# Patient Record
Sex: Female | Born: 1945 | Race: White | Hispanic: No | Marital: Single | Smoking: Never smoker
Health system: Southern US, Community
[De-identification: ages and names within clinical notes are randomized; demographics above are authoritative.]

## PROBLEM LIST (undated history)

## (undated) DIAGNOSIS — E119 Type 2 diabetes mellitus without complications: Secondary | ICD-10-CM

## (undated) DIAGNOSIS — M1990 Unspecified osteoarthritis, unspecified site: Secondary | ICD-10-CM

## (undated) DIAGNOSIS — F32A Depression, unspecified: Secondary | ICD-10-CM

## (undated) DIAGNOSIS — I10 Essential (primary) hypertension: Secondary | ICD-10-CM

## (undated) HISTORY — PX: BUNIONECTOMY: SHX129

## (undated) HISTORY — PX: APPENDECTOMY: SHX54

## (undated) HISTORY — PX: ABDOMINAL EXPLORATION SURGERY: SHX538

---

## 2015-06-19 DIAGNOSIS — E7800 Pure hypercholesterolemia, unspecified: Secondary | ICD-10-CM | POA: Diagnosis not present

## 2015-06-19 DIAGNOSIS — R7301 Impaired fasting glucose: Secondary | ICD-10-CM | POA: Diagnosis not present

## 2015-06-25 DIAGNOSIS — K2270 Barrett's esophagus without dysplasia: Secondary | ICD-10-CM | POA: Diagnosis not present

## 2015-06-25 DIAGNOSIS — I10 Essential (primary) hypertension: Secondary | ICD-10-CM | POA: Diagnosis not present

## 2015-06-25 DIAGNOSIS — F341 Dysthymic disorder: Secondary | ICD-10-CM | POA: Diagnosis not present

## 2015-06-25 DIAGNOSIS — E7800 Pure hypercholesterolemia, unspecified: Secondary | ICD-10-CM | POA: Diagnosis not present

## 2015-06-25 DIAGNOSIS — R7303 Prediabetes: Secondary | ICD-10-CM | POA: Diagnosis not present

## 2015-12-18 DIAGNOSIS — E7800 Pure hypercholesterolemia, unspecified: Secondary | ICD-10-CM | POA: Diagnosis not present

## 2015-12-18 DIAGNOSIS — I10 Essential (primary) hypertension: Secondary | ICD-10-CM | POA: Diagnosis not present

## 2015-12-18 DIAGNOSIS — R7303 Prediabetes: Secondary | ICD-10-CM | POA: Diagnosis not present

## 2015-12-24 DIAGNOSIS — F329 Major depressive disorder, single episode, unspecified: Secondary | ICD-10-CM | POA: Diagnosis not present

## 2015-12-24 DIAGNOSIS — K2270 Barrett's esophagus without dysplasia: Secondary | ICD-10-CM | POA: Diagnosis not present

## 2015-12-24 DIAGNOSIS — K219 Gastro-esophageal reflux disease without esophagitis: Secondary | ICD-10-CM | POA: Diagnosis not present

## 2015-12-24 DIAGNOSIS — E7800 Pure hypercholesterolemia, unspecified: Secondary | ICD-10-CM | POA: Diagnosis not present

## 2015-12-24 DIAGNOSIS — R7303 Prediabetes: Secondary | ICD-10-CM | POA: Diagnosis not present

## 2015-12-24 DIAGNOSIS — I10 Essential (primary) hypertension: Secondary | ICD-10-CM | POA: Diagnosis not present

## 2015-12-24 DIAGNOSIS — F341 Dysthymic disorder: Secondary | ICD-10-CM | POA: Diagnosis not present

## 2015-12-24 DIAGNOSIS — Z23 Encounter for immunization: Secondary | ICD-10-CM | POA: Diagnosis not present

## 2016-01-06 DIAGNOSIS — N838 Other noninflammatory disorders of ovary, fallopian tube and broad ligament: Secondary | ICD-10-CM | POA: Diagnosis not present

## 2016-02-05 DIAGNOSIS — D1801 Hemangioma of skin and subcutaneous tissue: Secondary | ICD-10-CM | POA: Diagnosis not present

## 2016-02-05 DIAGNOSIS — L820 Inflamed seborrheic keratosis: Secondary | ICD-10-CM | POA: Diagnosis not present

## 2016-02-05 DIAGNOSIS — D485 Neoplasm of uncertain behavior of skin: Secondary | ICD-10-CM | POA: Diagnosis not present

## 2016-02-09 DIAGNOSIS — R238 Other skin changes: Secondary | ICD-10-CM | POA: Diagnosis not present

## 2016-02-09 DIAGNOSIS — L820 Inflamed seborrheic keratosis: Secondary | ICD-10-CM | POA: Diagnosis not present

## 2016-03-03 DIAGNOSIS — K449 Diaphragmatic hernia without obstruction or gangrene: Secondary | ICD-10-CM | POA: Diagnosis not present

## 2016-03-03 DIAGNOSIS — K219 Gastro-esophageal reflux disease without esophagitis: Secondary | ICD-10-CM | POA: Diagnosis not present

## 2016-04-29 DIAGNOSIS — H5213 Myopia, bilateral: Secondary | ICD-10-CM | POA: Diagnosis not present

## 2016-04-29 DIAGNOSIS — H2513 Age-related nuclear cataract, bilateral: Secondary | ICD-10-CM | POA: Diagnosis not present

## 2016-05-06 DIAGNOSIS — Z1231 Encounter for screening mammogram for malignant neoplasm of breast: Secondary | ICD-10-CM | POA: Diagnosis not present

## 2016-06-17 DIAGNOSIS — R7303 Prediabetes: Secondary | ICD-10-CM | POA: Diagnosis not present

## 2016-06-23 DIAGNOSIS — Z6830 Body mass index (BMI) 30.0-30.9, adult: Secondary | ICD-10-CM | POA: Diagnosis not present

## 2016-06-23 DIAGNOSIS — F418 Other specified anxiety disorders: Secondary | ICD-10-CM | POA: Diagnosis not present

## 2016-06-23 DIAGNOSIS — I10 Essential (primary) hypertension: Secondary | ICD-10-CM | POA: Diagnosis not present

## 2016-06-23 DIAGNOSIS — E7800 Pure hypercholesterolemia, unspecified: Secondary | ICD-10-CM | POA: Diagnosis not present

## 2016-06-23 DIAGNOSIS — R7303 Prediabetes: Secondary | ICD-10-CM | POA: Diagnosis not present

## 2016-08-05 DIAGNOSIS — D1801 Hemangioma of skin and subcutaneous tissue: Secondary | ICD-10-CM | POA: Diagnosis not present

## 2016-08-05 DIAGNOSIS — R233 Spontaneous ecchymoses: Secondary | ICD-10-CM | POA: Diagnosis not present

## 2016-12-23 DIAGNOSIS — I10 Essential (primary) hypertension: Secondary | ICD-10-CM | POA: Diagnosis not present

## 2016-12-23 DIAGNOSIS — R7303 Prediabetes: Secondary | ICD-10-CM | POA: Diagnosis not present

## 2016-12-29 DIAGNOSIS — K2270 Barrett's esophagus without dysplasia: Secondary | ICD-10-CM | POA: Diagnosis not present

## 2016-12-29 DIAGNOSIS — Z6829 Body mass index (BMI) 29.0-29.9, adult: Secondary | ICD-10-CM | POA: Diagnosis not present

## 2016-12-29 DIAGNOSIS — K219 Gastro-esophageal reflux disease without esophagitis: Secondary | ICD-10-CM | POA: Diagnosis not present

## 2016-12-29 DIAGNOSIS — F329 Major depressive disorder, single episode, unspecified: Secondary | ICD-10-CM | POA: Diagnosis not present

## 2016-12-29 DIAGNOSIS — Z23 Encounter for immunization: Secondary | ICD-10-CM | POA: Diagnosis not present

## 2016-12-29 DIAGNOSIS — Z1389 Encounter for screening for other disorder: Secondary | ICD-10-CM | POA: Diagnosis not present

## 2016-12-29 DIAGNOSIS — R7303 Prediabetes: Secondary | ICD-10-CM | POA: Diagnosis not present

## 2016-12-29 DIAGNOSIS — I10 Essential (primary) hypertension: Secondary | ICD-10-CM | POA: Diagnosis not present

## 2016-12-29 DIAGNOSIS — E7800 Pure hypercholesterolemia, unspecified: Secondary | ICD-10-CM | POA: Diagnosis not present

## 2017-01-09 DIAGNOSIS — Z6828 Body mass index (BMI) 28.0-28.9, adult: Secondary | ICD-10-CM | POA: Diagnosis not present

## 2017-01-09 DIAGNOSIS — N838 Other noninflammatory disorders of ovary, fallopian tube and broad ligament: Secondary | ICD-10-CM | POA: Diagnosis not present

## 2017-01-09 DIAGNOSIS — Z01411 Encounter for gynecological examination (general) (routine) with abnormal findings: Secondary | ICD-10-CM | POA: Diagnosis not present

## 2017-01-09 DIAGNOSIS — Z79890 Hormone replacement therapy (postmenopausal): Secondary | ICD-10-CM | POA: Diagnosis not present

## 2017-01-20 DIAGNOSIS — K222 Esophageal obstruction: Secondary | ICD-10-CM | POA: Diagnosis not present

## 2017-01-20 DIAGNOSIS — K317 Polyp of stomach and duodenum: Secondary | ICD-10-CM | POA: Diagnosis not present

## 2017-01-20 DIAGNOSIS — K2270 Barrett's esophagus without dysplasia: Secondary | ICD-10-CM | POA: Diagnosis not present

## 2017-01-20 DIAGNOSIS — K228 Other specified diseases of esophagus: Secondary | ICD-10-CM | POA: Diagnosis not present

## 2017-07-25 DIAGNOSIS — Z780 Asymptomatic menopausal state: Secondary | ICD-10-CM | POA: Diagnosis not present

## 2017-07-25 DIAGNOSIS — Z79899 Other long term (current) drug therapy: Secondary | ICD-10-CM | POA: Diagnosis not present

## 2017-07-25 DIAGNOSIS — D649 Anemia, unspecified: Secondary | ICD-10-CM | POA: Diagnosis not present

## 2017-07-25 DIAGNOSIS — E7800 Pure hypercholesterolemia, unspecified: Secondary | ICD-10-CM | POA: Diagnosis not present

## 2017-07-25 DIAGNOSIS — K219 Gastro-esophageal reflux disease without esophagitis: Secondary | ICD-10-CM | POA: Diagnosis not present

## 2017-07-25 DIAGNOSIS — R7303 Prediabetes: Secondary | ICD-10-CM | POA: Diagnosis not present

## 2017-07-25 DIAGNOSIS — E559 Vitamin D deficiency, unspecified: Secondary | ICD-10-CM | POA: Diagnosis not present

## 2017-07-25 DIAGNOSIS — K2270 Barrett's esophagus without dysplasia: Secondary | ICD-10-CM | POA: Diagnosis not present

## 2017-07-25 DIAGNOSIS — I10 Essential (primary) hypertension: Secondary | ICD-10-CM | POA: Diagnosis not present

## 2017-08-15 DIAGNOSIS — R300 Dysuria: Secondary | ICD-10-CM | POA: Diagnosis not present

## 2017-08-24 DIAGNOSIS — D508 Other iron deficiency anemias: Secondary | ICD-10-CM | POA: Diagnosis not present

## 2017-08-24 DIAGNOSIS — N950 Postmenopausal bleeding: Secondary | ICD-10-CM | POA: Diagnosis not present

## 2017-08-24 DIAGNOSIS — F5104 Psychophysiologic insomnia: Secondary | ICD-10-CM | POA: Diagnosis not present

## 2017-08-24 DIAGNOSIS — R634 Abnormal weight loss: Secondary | ICD-10-CM | POA: Diagnosis not present

## 2017-08-24 DIAGNOSIS — R7303 Prediabetes: Secondary | ICD-10-CM | POA: Diagnosis not present

## 2017-09-18 DIAGNOSIS — Z79890 Hormone replacement therapy (postmenopausal): Secondary | ICD-10-CM | POA: Diagnosis not present

## 2017-09-18 DIAGNOSIS — D260 Other benign neoplasm of cervix uteri: Secondary | ICD-10-CM | POA: Diagnosis not present

## 2017-09-18 DIAGNOSIS — N950 Postmenopausal bleeding: Secondary | ICD-10-CM | POA: Diagnosis not present

## 2017-10-25 ENCOUNTER — Other Ambulatory Visit: Payer: Self-pay | Admitting: Obstetrics and Gynecology

## 2017-10-25 DIAGNOSIS — Z1231 Encounter for screening mammogram for malignant neoplasm of breast: Secondary | ICD-10-CM

## 2017-11-17 ENCOUNTER — Ambulatory Visit
Admission: RE | Admit: 2017-11-17 | Discharge: 2017-11-17 | Disposition: A | Discharge status: Home / Self Care | Payer: Medicare Other | Source: Ambulatory Visit | Attending: Obstetrics and Gynecology | Admitting: Obstetrics and Gynecology

## 2017-11-17 DIAGNOSIS — Z1231 Encounter for screening mammogram for malignant neoplasm of breast: Secondary | ICD-10-CM

## 2017-11-27 ENCOUNTER — Other Ambulatory Visit: Payer: Self-pay | Admitting: Obstetrics and Gynecology

## 2017-11-27 DIAGNOSIS — R921 Mammographic calcification found on diagnostic imaging of breast: Secondary | ICD-10-CM

## 2017-11-29 ENCOUNTER — Other Ambulatory Visit: Payer: Self-pay | Admitting: Obstetrics and Gynecology

## 2017-11-29 ENCOUNTER — Ambulatory Visit: Payer: Medicare Other

## 2017-11-29 ENCOUNTER — Ambulatory Visit
Admission: RE | Admit: 2017-11-29 | Discharge: 2017-11-29 | Disposition: A | Discharge status: Home / Self Care | Payer: Medicare Other | Source: Ambulatory Visit | Attending: Obstetrics and Gynecology | Admitting: Obstetrics and Gynecology

## 2017-11-29 DIAGNOSIS — R921 Mammographic calcification found on diagnostic imaging of breast: Secondary | ICD-10-CM

## 2017-11-30 ENCOUNTER — Other Ambulatory Visit: Payer: Self-pay | Admitting: Obstetrics and Gynecology

## 2017-12-18 DIAGNOSIS — N950 Postmenopausal bleeding: Secondary | ICD-10-CM | POA: Diagnosis not present

## 2017-12-18 DIAGNOSIS — Z79890 Hormone replacement therapy (postmenopausal): Secondary | ICD-10-CM | POA: Diagnosis not present

## 2017-12-26 DIAGNOSIS — D508 Other iron deficiency anemias: Secondary | ICD-10-CM | POA: Diagnosis not present

## 2017-12-26 DIAGNOSIS — K2270 Barrett's esophagus without dysplasia: Secondary | ICD-10-CM | POA: Diagnosis not present

## 2017-12-26 DIAGNOSIS — R7303 Prediabetes: Secondary | ICD-10-CM | POA: Diagnosis not present

## 2017-12-26 DIAGNOSIS — N950 Postmenopausal bleeding: Secondary | ICD-10-CM | POA: Diagnosis not present

## 2017-12-26 DIAGNOSIS — Z23 Encounter for immunization: Secondary | ICD-10-CM | POA: Diagnosis not present

## 2018-01-02 DIAGNOSIS — H04123 Dry eye syndrome of bilateral lacrimal glands: Secondary | ICD-10-CM | POA: Diagnosis not present

## 2018-01-02 DIAGNOSIS — H524 Presbyopia: Secondary | ICD-10-CM | POA: Diagnosis not present

## 2018-01-02 DIAGNOSIS — H1852 Epithelial (juvenile) corneal dystrophy: Secondary | ICD-10-CM | POA: Diagnosis not present

## 2018-01-06 DIAGNOSIS — S9032XA Contusion of left foot, initial encounter: Secondary | ICD-10-CM | POA: Diagnosis not present

## 2018-01-06 DIAGNOSIS — R937 Abnormal findings on diagnostic imaging of other parts of musculoskeletal system: Secondary | ICD-10-CM | POA: Diagnosis not present

## 2018-02-01 DIAGNOSIS — K2270 Barrett's esophagus without dysplasia: Secondary | ICD-10-CM | POA: Diagnosis not present

## 2018-02-13 DIAGNOSIS — Z0000 Encounter for general adult medical examination without abnormal findings: Secondary | ICD-10-CM | POA: Diagnosis not present

## 2018-02-13 DIAGNOSIS — R921 Mammographic calcification found on diagnostic imaging of breast: Secondary | ICD-10-CM | POA: Diagnosis not present

## 2018-02-13 DIAGNOSIS — F5104 Psychophysiologic insomnia: Secondary | ICD-10-CM | POA: Diagnosis not present

## 2018-02-13 DIAGNOSIS — K219 Gastro-esophageal reflux disease without esophagitis: Secondary | ICD-10-CM | POA: Diagnosis not present

## 2018-02-13 DIAGNOSIS — Z1389 Encounter for screening for other disorder: Secondary | ICD-10-CM | POA: Diagnosis not present

## 2018-02-13 DIAGNOSIS — K2270 Barrett's esophagus without dysplasia: Secondary | ICD-10-CM | POA: Diagnosis not present

## 2018-02-13 DIAGNOSIS — R7303 Prediabetes: Secondary | ICD-10-CM | POA: Diagnosis not present

## 2018-02-13 DIAGNOSIS — I10 Essential (primary) hypertension: Secondary | ICD-10-CM | POA: Diagnosis not present

## 2018-02-13 DIAGNOSIS — F419 Anxiety disorder, unspecified: Secondary | ICD-10-CM | POA: Diagnosis not present

## 2018-02-13 DIAGNOSIS — Z1211 Encounter for screening for malignant neoplasm of colon: Secondary | ICD-10-CM | POA: Diagnosis not present

## 2018-02-13 DIAGNOSIS — E7800 Pure hypercholesterolemia, unspecified: Secondary | ICD-10-CM | POA: Diagnosis not present

## 2018-02-13 DIAGNOSIS — N950 Postmenopausal bleeding: Secondary | ICD-10-CM | POA: Diagnosis not present

## 2018-04-27 DIAGNOSIS — Z1211 Encounter for screening for malignant neoplasm of colon: Secondary | ICD-10-CM | POA: Diagnosis not present

## 2018-04-27 DIAGNOSIS — N950 Postmenopausal bleeding: Secondary | ICD-10-CM | POA: Diagnosis not present

## 2018-06-05 ENCOUNTER — Ambulatory Visit
Admission: RE | Admit: 2018-06-05 | Discharge: 2018-06-05 | Disposition: A | Discharge status: Home / Self Care | Payer: Medicare Other | Source: Ambulatory Visit | Attending: Obstetrics and Gynecology | Admitting: Obstetrics and Gynecology

## 2018-06-05 ENCOUNTER — Other Ambulatory Visit: Payer: Self-pay | Admitting: Obstetrics and Gynecology

## 2018-06-05 DIAGNOSIS — R921 Mammographic calcification found on diagnostic imaging of breast: Secondary | ICD-10-CM

## 2018-06-12 ENCOUNTER — Other Ambulatory Visit: Payer: Self-pay | Admitting: Obstetrics and Gynecology

## 2018-08-03 DIAGNOSIS — E559 Vitamin D deficiency, unspecified: Secondary | ICD-10-CM | POA: Diagnosis not present

## 2018-08-03 DIAGNOSIS — E7800 Pure hypercholesterolemia, unspecified: Secondary | ICD-10-CM | POA: Diagnosis not present

## 2018-08-03 DIAGNOSIS — K219 Gastro-esophageal reflux disease without esophagitis: Secondary | ICD-10-CM | POA: Diagnosis not present

## 2018-08-03 DIAGNOSIS — I10 Essential (primary) hypertension: Secondary | ICD-10-CM | POA: Diagnosis not present

## 2018-08-03 DIAGNOSIS — F411 Generalized anxiety disorder: Secondary | ICD-10-CM | POA: Diagnosis not present

## 2018-08-03 DIAGNOSIS — R7303 Prediabetes: Secondary | ICD-10-CM | POA: Diagnosis not present

## 2018-08-03 DIAGNOSIS — Z0000 Encounter for general adult medical examination without abnormal findings: Secondary | ICD-10-CM | POA: Diagnosis not present

## 2018-08-03 DIAGNOSIS — F5101 Primary insomnia: Secondary | ICD-10-CM | POA: Diagnosis not present

## 2018-08-06 DIAGNOSIS — R7303 Prediabetes: Secondary | ICD-10-CM | POA: Diagnosis not present

## 2018-08-06 DIAGNOSIS — F411 Generalized anxiety disorder: Secondary | ICD-10-CM | POA: Diagnosis not present

## 2018-08-06 DIAGNOSIS — E559 Vitamin D deficiency, unspecified: Secondary | ICD-10-CM | POA: Diagnosis not present

## 2018-08-06 DIAGNOSIS — E7800 Pure hypercholesterolemia, unspecified: Secondary | ICD-10-CM | POA: Diagnosis not present

## 2018-08-06 DIAGNOSIS — I10 Essential (primary) hypertension: Secondary | ICD-10-CM | POA: Diagnosis not present

## 2018-11-27 ENCOUNTER — Other Ambulatory Visit: Payer: Self-pay | Admitting: Obstetrics and Gynecology

## 2018-11-27 DIAGNOSIS — E2839 Other primary ovarian failure: Secondary | ICD-10-CM

## 2018-11-27 DIAGNOSIS — Z01419 Encounter for gynecological examination (general) (routine) without abnormal findings: Secondary | ICD-10-CM | POA: Diagnosis not present

## 2018-12-11 ENCOUNTER — Other Ambulatory Visit: Payer: Self-pay

## 2018-12-11 ENCOUNTER — Ambulatory Visit
Admission: RE | Admit: 2018-12-11 | Discharge: 2018-12-11 | Disposition: A | Discharge status: Home / Self Care | Payer: Medicare Other | Source: Ambulatory Visit | Attending: Obstetrics and Gynecology | Admitting: Obstetrics and Gynecology

## 2018-12-11 DIAGNOSIS — R921 Mammographic calcification found on diagnostic imaging of breast: Secondary | ICD-10-CM | POA: Diagnosis not present

## 2018-12-11 DIAGNOSIS — R922 Inconclusive mammogram: Secondary | ICD-10-CM | POA: Diagnosis not present

## 2018-12-13 DIAGNOSIS — E7800 Pure hypercholesterolemia, unspecified: Secondary | ICD-10-CM | POA: Diagnosis not present

## 2018-12-13 DIAGNOSIS — R7303 Prediabetes: Secondary | ICD-10-CM | POA: Diagnosis not present

## 2018-12-19 DIAGNOSIS — E7800 Pure hypercholesterolemia, unspecified: Secondary | ICD-10-CM | POA: Diagnosis not present

## 2018-12-19 DIAGNOSIS — E559 Vitamin D deficiency, unspecified: Secondary | ICD-10-CM | POA: Diagnosis not present

## 2018-12-19 DIAGNOSIS — I10 Essential (primary) hypertension: Secondary | ICD-10-CM | POA: Diagnosis not present

## 2018-12-19 DIAGNOSIS — Z23 Encounter for immunization: Secondary | ICD-10-CM | POA: Diagnosis not present

## 2018-12-19 DIAGNOSIS — F411 Generalized anxiety disorder: Secondary | ICD-10-CM | POA: Diagnosis not present

## 2018-12-19 DIAGNOSIS — Z0000 Encounter for general adult medical examination without abnormal findings: Secondary | ICD-10-CM | POA: Diagnosis not present

## 2018-12-19 DIAGNOSIS — R7303 Prediabetes: Secondary | ICD-10-CM | POA: Diagnosis not present

## 2019-01-15 ENCOUNTER — Other Ambulatory Visit: Payer: Medicare Other

## 2019-02-14 ENCOUNTER — Ambulatory Visit
Admission: RE | Admit: 2019-02-14 | Discharge: 2019-02-14 | Disposition: A | Discharge status: Home / Self Care | Payer: Medicare Other | Source: Ambulatory Visit | Attending: Obstetrics and Gynecology | Admitting: Obstetrics and Gynecology

## 2019-02-14 ENCOUNTER — Other Ambulatory Visit: Payer: Self-pay

## 2019-02-14 DIAGNOSIS — E2839 Other primary ovarian failure: Secondary | ICD-10-CM

## 2019-02-14 DIAGNOSIS — M85851 Other specified disorders of bone density and structure, right thigh: Secondary | ICD-10-CM | POA: Diagnosis not present

## 2019-02-14 DIAGNOSIS — Z780 Asymptomatic menopausal state: Secondary | ICD-10-CM | POA: Diagnosis not present

## 2019-03-04 DIAGNOSIS — Z8719 Personal history of other diseases of the digestive system: Secondary | ICD-10-CM | POA: Diagnosis not present

## 2019-04-15 ENCOUNTER — Other Ambulatory Visit: Payer: Self-pay | Admitting: Obstetrics and Gynecology

## 2019-04-15 DIAGNOSIS — Z1231 Encounter for screening mammogram for malignant neoplasm of breast: Secondary | ICD-10-CM

## 2019-04-20 IMAGING — MG DIGITAL DIAGNOSTIC BILATERAL MAMMOGRAM WITH CAD
7 series · 7 of 7 positions shown · non-contrast
Comparison: Previous exam(s).

CLINICAL DATA: Screening recall for bilateral breast
calcifications.

EXAM:
DIGITAL DIAGNOSTIC BILATERAL MAMMOGRAM WITH CAD

[L CC]
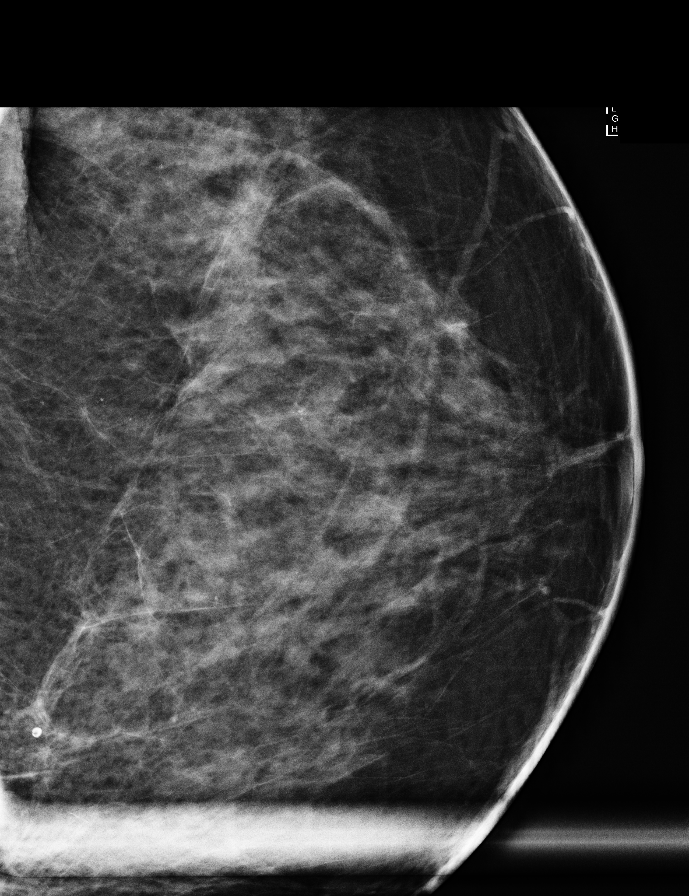

[R ML (1 of 2)]
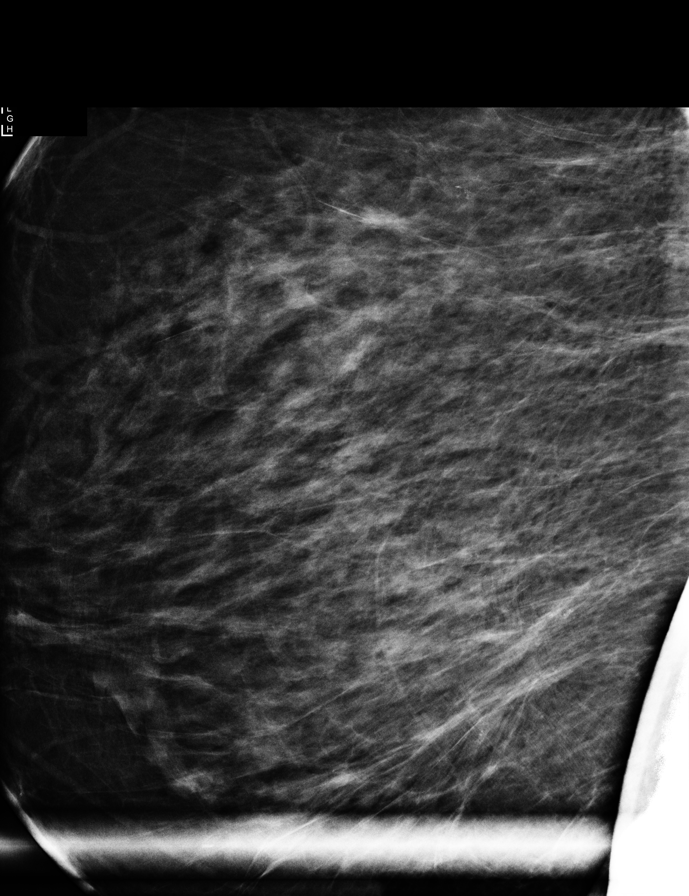

[L ML (1 of 2)]
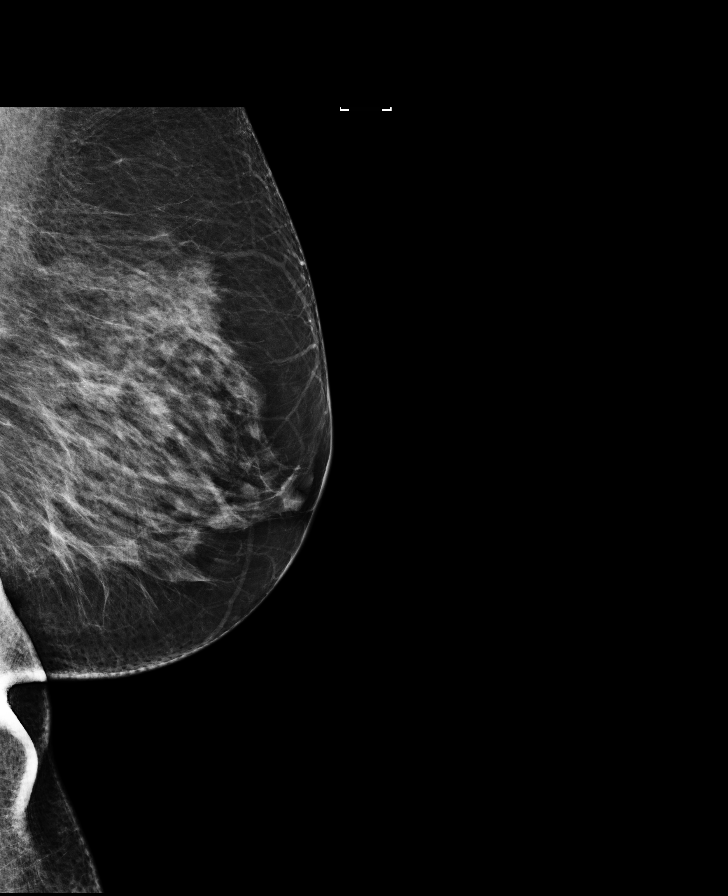

[L ML (2 of 2)]
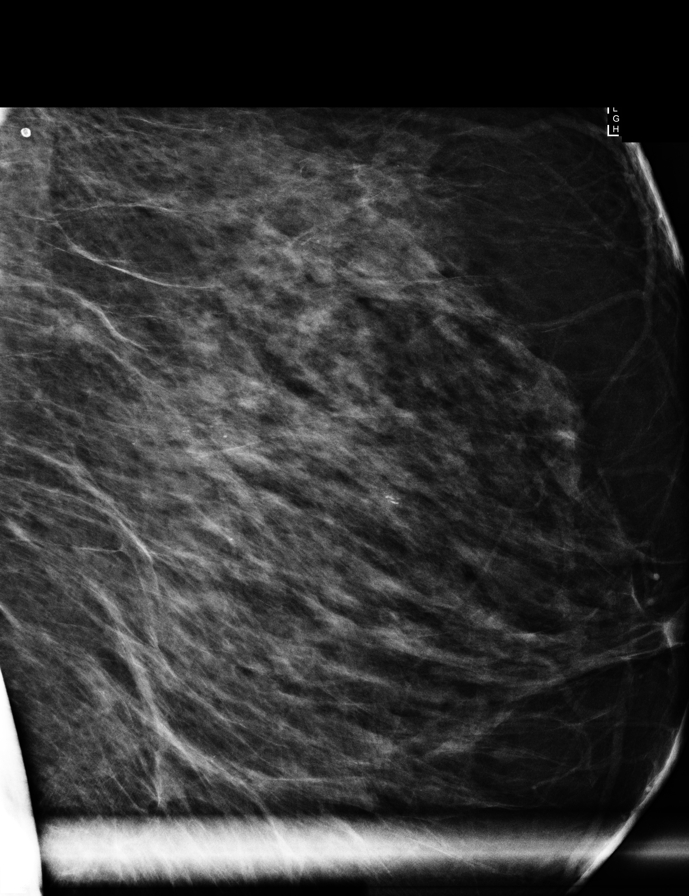

[R ML (2 of 2)]
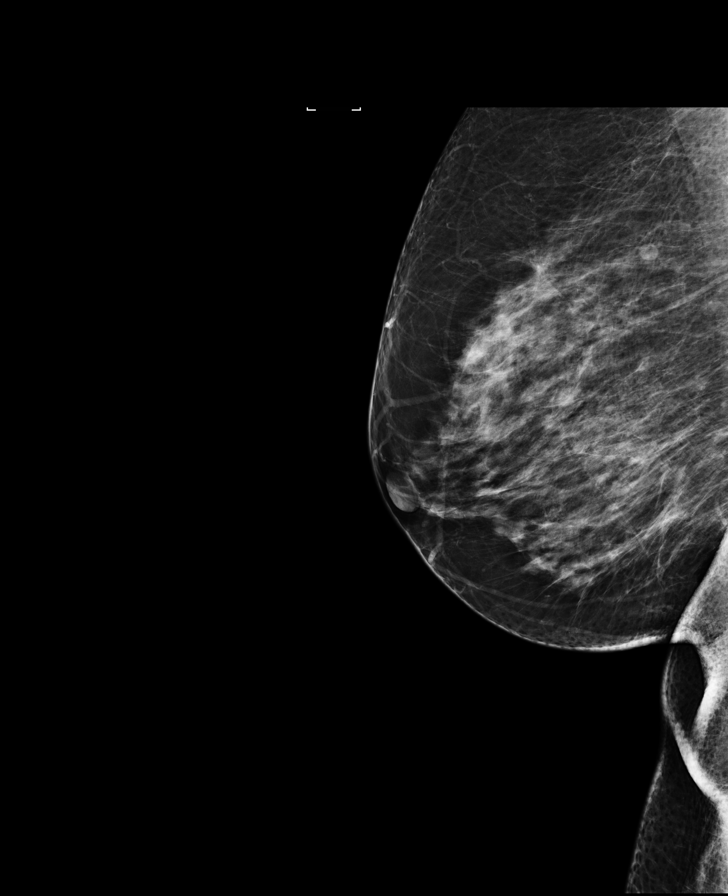

[R CC (1 of 2)]
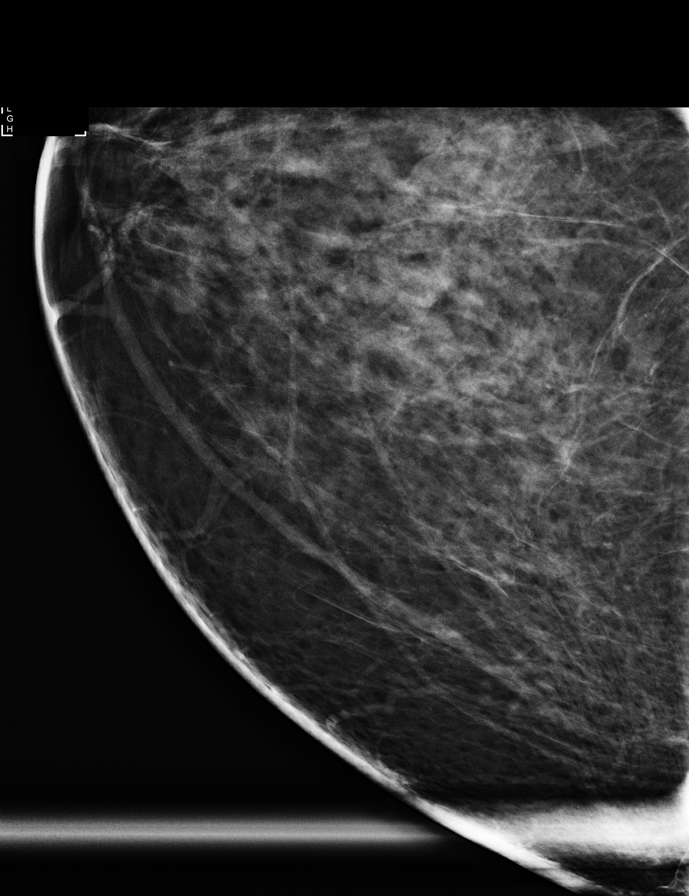

[R CC (2 of 2)]
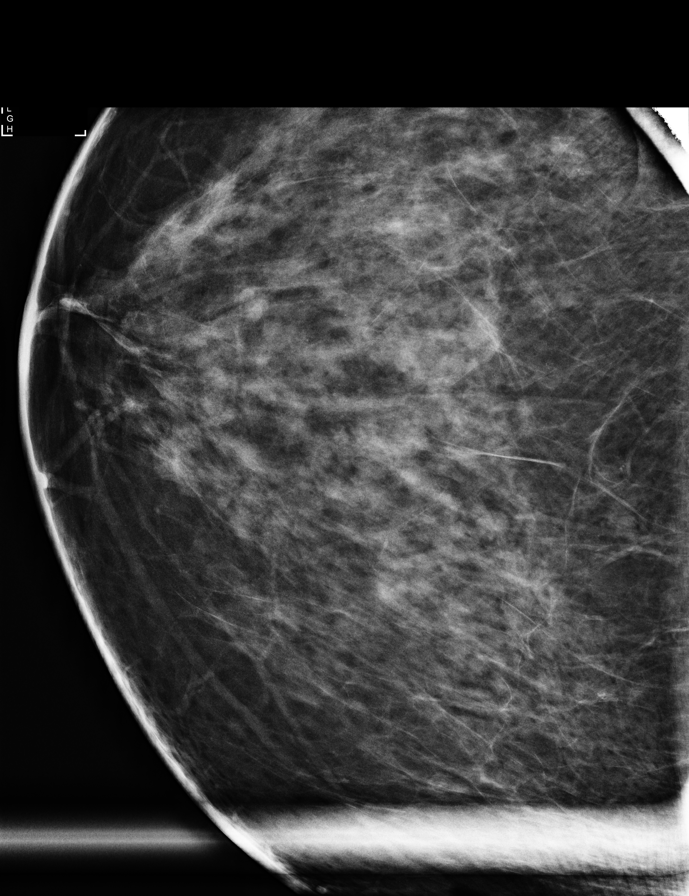

[7 of 7 positions shown; findings below may reference images not displayed]

ACR Breast Density Category c: The breast tissue is heterogeneously
dense, which may obscure small masses.
FINDINGS: In the right breast, the initially apparent linear calcifications
are less numerous, with no clear linearity. Largest group spans
approximately 4 mm. There are 2 discrete groups, which lie along the
inferior, medial aspect of the right breast. There is no associated
mass or distortion.

In the left breast, there is a small group of calcifications
centrally spanning 5 mm. There several round calcifications
posterior to this, loosely grouped. There is no significant
linearity and no associated mass or distortion.

Mammographic images were processed with CAD.
IMPRESSION: 1. Probably benign bilateral breast calcifications. Short-term
follow-up recommended.

RECOMMENDATION:
Bilateral diagnostic mammography with magnification views in 6
months.

I have discussed the findings and recommendations with the patient.
Results were also provided in writing at the conclusion of the
visit. If applicable, a reminder letter will be sent to the patient
regarding the next appointment.

BI-RADS CATEGORY  3: Probably benign.

## 2019-04-23 ENCOUNTER — Ambulatory Visit: Payer: Medicare Other | Attending: Internal Medicine

## 2019-04-23 DIAGNOSIS — Z23 Encounter for immunization: Secondary | ICD-10-CM

## 2019-04-23 NOTE — Progress Notes (Signed)
   Covid-19 Vaccination Clinic  Name:  Cindy Lambert    MRN: [id number] DOB: 05/11/1945  04/23/2019  Cindy Lambert was observed post Covid-19 immunization for 15 minutes without incidence. She was provided with Vaccine Information Sheet and instruction to access the V-Safe system.   Cindy Lambert was instructed to call 911 with any severe reactions post vaccine: Marland Kitchen Difficulty breathing  . Swelling of your face and throat  . A fast heartbeat  . A bad rash all over your body  . Dizziness and weakness    Immunizations Administered    Name Date Dose VIS Date Route   Pfizer COVID-19 Vaccine 04/23/2019 10:27 AM 0.3 mL 03/15/2019 Intramuscular   Manufacturer: [place]   Lot: [id number]   [place]: [id number]

## 2019-05-14 ENCOUNTER — Ambulatory Visit: Payer: Medicare Other | Attending: Internal Medicine

## 2019-05-14 DIAGNOSIS — Z23 Encounter for immunization: Secondary | ICD-10-CM | POA: Insufficient documentation

## 2019-05-14 NOTE — Progress Notes (Signed)
   Covid-19 Vaccination Clinic  Name:  Tahliyah Dajani    MRN: [id number] DOB: 18-Oct-1945  05/14/2019  Ms. Davidian was observed post Covid-19 immunization for 15 minutes without incidence. She was provided with Vaccine Information Sheet and instruction to access the V-Safe system.   Ms. Coggeshall was instructed to call 911 with any severe reactions post vaccine: Marland Kitchen Difficulty breathing  . Swelling of your face and throat  . A fast heartbeat  . A bad rash all over your body  . Dizziness and weakness    Immunizations Administered    Name Date Dose VIS Date Route   Pfizer COVID-19 Vaccine 05/14/2019 10:16 AM 0.3 mL 03/15/2019 Intramuscular   Manufacturer: [place]   Lot: [id number]   [place]: [id number]

## 2019-12-12 ENCOUNTER — Other Ambulatory Visit: Payer: Self-pay

## 2019-12-12 ENCOUNTER — Ambulatory Visit
Admission: RE | Admit: 2019-12-12 | Discharge: 2019-12-12 | Disposition: A | Discharge status: Home / Self Care | Payer: Medicare Other | Source: Ambulatory Visit | Attending: Obstetrics and Gynecology | Admitting: Obstetrics and Gynecology

## 2019-12-12 DIAGNOSIS — Z1231 Encounter for screening mammogram for malignant neoplasm of breast: Secondary | ICD-10-CM

## 2020-07-13 ENCOUNTER — Encounter: Payer: Self-pay | Admitting: Orthopaedic Surgery

## 2020-07-13 ENCOUNTER — Ambulatory Visit (INDEPENDENT_AMBULATORY_CARE_PROVIDER_SITE_OTHER): Payer: Medicare Other

## 2020-07-13 ENCOUNTER — Ambulatory Visit (INDEPENDENT_AMBULATORY_CARE_PROVIDER_SITE_OTHER): Payer: Medicare Other | Admitting: Orthopaedic Surgery

## 2020-07-13 VITALS — Ht 66.0 in | Wt 175.0 lb

## 2020-07-13 DIAGNOSIS — M25552 Pain in left hip: Secondary | ICD-10-CM

## 2020-07-13 DIAGNOSIS — M1612 Unilateral primary osteoarthritis, left hip: Secondary | ICD-10-CM | POA: Diagnosis not present

## 2020-07-13 NOTE — Progress Notes (Signed)
Office Visit Note   Patient: Cindy Lambert           Date of Birth: [birth year]           MRN: [id number] Visit Date: 07/13/2020              Requested by: Lanice Shirts, MD [address] [place],  [place] [address] PCP: Lanice Shirts, MD   Assessment & Plan: Visit Diagnoses:  1. Pain in left hip   2. Unilateral primary osteoarthritis, left hip     Plan: Given the severity of her left hip arthritis and pain manifesting clinical exam findings and radiographic findings including plain films and an MRI we are recommending a left total hip arthroplasty.  I showed her hip model explained in detail what the surgery involves.  We talked about her intraoperative and postoperative course.  I described in detail the risk benefits of surgery and how this is performed.  All question concerns were answered and addressed.  Given the failure of all forms conservative treatment for many years now we are happy to proceed with a replacement surgery for her left hip.  Follow-Up Instructions: Return for 2 weeks post-op.   Orders:  Orders Placed This Encounter  Procedures  . XR HIP UNILAT W OR W/O PELVIS 2-3 VIEWS LEFT   No orders of the defined types were placed in this encounter.     Procedures: No procedures performed   Clinical Data: No additional findings.   Subjective: Chief Complaint  Patient presents with  . Left Hip - Pain  The patient comes in today to talk about hip replacement surgery for her left hip.  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  For several years now she has developed worsening left hip pain.  She has put this off until she is finally retired from work.  She does take Advil for pain on a daily basis.  She tried to offload her left hip and modify her activities.  She is worked on left hip strengthening exercises.  She is known for a very long time that she has arthritis in her left hip.  It  is at the point where her pain is daily and is 10 out of 10.  Her left hip pain is definitely affecting her mobility, her quality of life and her actives daily living.  She is a prediabetic.  She does have high blood pressure for which she takes medications.  This is gotten progressively worse in terms of pain in the groin area.  A MRI of her left hip accompanies her and it does show significant osteoarthritis left hip joint significant joint space narrowing, subchondral cyst in the femoral head and acetabulum and areas of full-thickness cartilage loss of the left hip joint.  HPI  Review of Systems She currently denies any headache, chest pain, shortness of breath, fever, chills, nausea, vomiting  Objective: Vital Signs: Ht 5\' 6"  (1.676 m)   Wt 175 lb (79.4 kg)   BMI 28.25 kg/m   Physical Exam She is alert and orient x3 and in no acute distress Ortho Exam Examination of her left hip shows significant pain with internal and external rotation of that hip and compressing the joint.  There is stiffness with rotation.  Her right hip exam is normal. Specialty Comments:  No specialty comments available.  Imaging: XR HIP UNILAT W OR W/O PELVIS 2-3 VIEWS  LEFT  Result Date: 07/13/2020 An AP pelvis lateral left hip show severe arthritis of left hip with cystic changes in the acetabular roof and femoral head.  There is significant joint space narrowing and particular osteophytes.  The right hip is entirely normal.    PMFS History: Patient Active Problem List   Diagnosis Date Noted  . Unilateral primary osteoarthritis, left hip 07/13/2020   No past medical history on file.  No family history on file.  No past surgical history on file. Social History   Occupational History  . Not on file  Tobacco Use  . Smoking status: Never Smoker  . Smokeless tobacco: Never Used  Substance and Sexual Activity  . Alcohol use: Not on file  . Drug use: Not on file  . Sexual activity: Not on file

## 2020-07-23 ENCOUNTER — Other Ambulatory Visit: Payer: Self-pay

## 2020-08-04 ENCOUNTER — Other Ambulatory Visit: Payer: Self-pay | Admitting: Physician Assistant

## 2020-08-13 NOTE — Patient Instructions (Addendum)
DUE TO COVID-19 ONLY ONE VISITOR IS ALLOWED TO COME WITH YOU AND STAY IN THE WAITING ROOM ONLY DURING PRE OP AND PROCEDURE DAY OF SURGERY. THE 1 VISITOR  MAY VISIT WITH YOU AFTER SURGERY IN YOUR PRIVATE ROOM DURING VISITING HOURS ONLY!  YOU NEED TO HAVE A COVID 19 TEST ON: 08/18/20 @ 9:15 AM , THIS TEST MUST BE DONE BEFORE SURGERY,  COVID TESTING SITE [place] [address], IT IS ON THE RIGHT GOING OUT [address] APPROXIMATELY  2 MINUTES PAST [employer] ON THE RIGHT. ONCE YOUR COVID TEST IS COMPLETED,  PLEASE BEGIN THE QUARANTINE INSTRUCTIONS AS OUTLINED IN YOUR HANDOUT.                Cindy Lambert   Your procedure is scheduled on: 08/21/20   Report to [facility] Main  Entrance   Report to admitting at : 6:00 AM     Call this number if you have problems the morning of surgery [phone]    Remember: NO SOLID FOOD AFTER MIDNIGHT THE NIGHT PRIOR TO SURGERY. NOTHING BY MOUTH EXCEPT CLEAR LIQUIDS UNTIL: 5:30 AM . PLEASE FINISH GATORADE DRINK PER SURGEON ORDER  WHICH NEEDS TO BE COMPLETED AT: 5:30 AM .  CLEAR LIQUID DIET   Foods Allowed                                                                     Foods Excluded  Coffee and tea, regular and decaf                             liquids that you cannot  Plain Jell-O any favor except red or purple                                           see through such as: Fruit ices (not with fruit pulp)                                     milk, soups, orange juice  Iced Popsicles                                    All solid food Carbonated beverages, regular and diet                                    Cranberry, grape and apple juices Sports drinks like Gatorade Lightly seasoned clear broth or consume(fat free) Sugar, honey syrup  Sample Menu Breakfast                                Lunch  Supper Cranberry juice                    Beef broth                             Chicken broth Jell-O                                     Grape juice                           Apple juice Coffee or tea                        Jell-O                                      Popsicle                                                Coffee or tea                        Coffee or tea  _____________________________________________________________________   BRUSH YOUR TEETH MORNING OF SURGERY AND RINSE YOUR MOUTH OUT, NO CHEWING GUM CANDY OR MINTS.  Take these medicines the morning of surgery with A SIP OF WATER: citalopram,omeprazole.  How to Manage Your Diabetes Before and After Surgery  Why is it important to control my blood sugar before and after surgery? . Improving blood sugar levels before and after surgery helps healing and can limit problems. . A way of improving blood sugar control is eating a healthy diet by: o  Eating less sugar and carbohydrates o  Increasing activity/exercise o  Talking with your doctor about reaching your blood sugar goals . High blood sugars (greater than 180 mg/dL) can raise your risk of infections and slow your recovery, so you will need to focus on controlling your diabetes during the weeks before surgery. . Make sure that the doctor who takes care of your diabetes knows about your planned surgery including the date and location.  How do I manage my blood sugar before surgery? . Check your blood sugar at least 4 times a day, starting 2 days before surgery, to make sure that the level is not too high or low. o Check your blood sugar the morning of your surgery when you wake up and every 2 hours until you get to the Short Stay unit. . If your blood sugar is less than 70 mg/dL, you will need to treat for low blood sugar: o Do not take insulin. o Treat a low blood sugar (less than 70 mg/dL) with  cup of clear juice (cranberry or apple), 4 glucose tablets, OR glucose gel. o Recheck blood sugar in 15 minutes after treatment (to make sure it is  greater than 70 mg/dL). If your blood sugar is not greater than 70 mg/dL on recheck, call [phone] for further instructions. . Report your blood sugar to the short stay nurse when you get to Short Stay.  . If you are admitted to  the hospital after surgery: o Your blood sugar will be checked by the staff and you will probably be given insulin after surgery (instead of oral diabetes medicines) to make sure you have good blood sugar levels. o The goal for blood sugar control after surgery is 80-180 mg/dL.   WHAT DO I DO ABOUT MY DIABETES MEDICATION?  Marland Kitchen Do not take oral diabetes medicines (pills) the morning of surgery.  . THE DAY BEFORE SURGERY, take Metformin as usual   THE MORNING OF SURGERY, DO NOT TAKE ANY DIABETIC MEDICATIONS DAY OF YOUR SURGERY                               You may not have any metal on your body including hair pins and              piercings  Do not wear jewelry, make-up, lotions, powders or perfumes, deodorant             Do not wear nail polish on your fingernails.  Do not shave  48 hours prior to surgery.    Do not bring valuables to the hospital. [place].  Contacts, dentures or bridgework may not be worn into surgery.  Leave suitcase in the car. After surgery it may be brought to your room.     Patients discharged the day of surgery will not be allowed to drive home. IF YOU ARE HAVING SURGERY AND GOING HOME THE SAME DAY, YOU MUST HAVE AN ADULT TO DRIVE YOU HOME AND BE WITH YOU FOR 24 HOURS. YOU MAY GO HOME BY TAXI OR UBER OR ORTHERWISE, BUT AN ADULT MUST ACCOMPANY YOU HOME AND STAY WITH YOU FOR 24 HOURS.  Name and phone number of your driver:  Special Instructions: N/A              Please read over the following fact sheets you were given: _____________________________________________________________________          [facility] - Preparing for Surgery Before surgery, you can play an important role.   Because skin is not sterile, your skin needs to be as free of germs as possible.  You can reduce the number of germs on your skin by washing with CHG (chlorahexidine gluconate) soap before surgery.  CHG is an antiseptic cleaner which kills germs and bonds with the skin to continue killing germs even after washing. Please DO NOT use if you have an allergy to CHG or antibacterial soaps.  If your skin becomes reddened/irritated stop using the CHG and inform your nurse when you arrive at Short Stay. Do not shave (including legs and underarms) for at least 48 hours prior to the first CHG shower.  You may shave your face/neck. Please follow these instructions carefully:  1.  Shower with CHG Soap the night before surgery and the  morning of Surgery.  2.  If you choose to wash your hair, wash your hair first as usual with your  normal  shampoo.  3.  After you shampoo, rinse your hair and body thoroughly to remove the  shampoo.                           4.  Use CHG as you would any other liquid soap.  You can apply chg  directly  to the skin and wash                       Gently with a scrungie or clean washcloth.  5.  Apply the CHG Soap to your body ONLY FROM THE NECK DOWN.   Do not use on face/ open                           Wound or open sores. Avoid contact with eyes, ears mouth and genitals (private parts).                       Wash face,  Genitals (private parts) with your normal soap.             6.  Wash thoroughly, paying special attention to the area where your surgery  will be performed.  7.  Thoroughly rinse your body with warm water from the neck down.  8.  DO NOT shower/wash with your normal soap after using and rinsing off  the CHG Soap.                9.  Pat yourself dry with a clean towel.            10.  Wear clean pajamas.            11.  Place clean sheets on your bed the night of your first shower and do not  sleep with pets. Day of Surgery : Do not apply any lotions/deodorants the  morning of surgery.  Please wear clean clothes to the hospital/surgery center.  FAILURE TO FOLLOW THESE INSTRUCTIONS MAY RESULT IN THE CANCELLATION OF YOUR SURGERY PATIENT SIGNATURE_________________________________  NURSE SIGNATURE__________________________________  ________________________________________________________________________   Adam Phenix  An incentive spirometer is a tool that can help keep your lungs clear and active. This tool measures how well you are filling your lungs with each breath. Taking long deep breaths may help reverse or decrease the chance of developing breathing (pulmonary) problems (especially infection) following:  A long period of time when you are unable to move or be active. BEFORE THE PROCEDURE   If the spirometer includes an indicator to show your best effort, your nurse or respiratory therapist will set it to a desired goal.  If possible, sit up straight or lean slightly forward. Try not to slouch.  Hold the incentive spirometer in an upright position. INSTRUCTIONS FOR USE  1. Sit on the edge of your bed if possible, or sit up as far as you can in bed or on a chair. 2. Hold the incentive spirometer in an upright position. 3. Breathe out normally. 4. Place the mouthpiece in your mouth and seal your lips tightly around it. 5. Breathe in slowly and as deeply as possible, raising the piston or the ball toward the top of the column. 6. Hold your breath for 3-5 seconds or for as long as possible. Allow the piston or ball to fall to the bottom of the column. 7. Remove the mouthpiece from your mouth and breathe out normally. 8. Rest for a few seconds and repeat Steps 1 through 7 at least 10 times every 1-2 hours when you are awake. Take your time and take a few normal breaths between deep breaths. 9. The spirometer may include an indicator to show your best effort. Use the indicator as a goal to work toward during each repetition. 10.  After each  set of 10 deep breaths, practice coughing to be sure your lungs are clear. If you have an incision (the cut made at the time of surgery), support your incision when coughing by placing a pillow or rolled up towels firmly against it. Once you are able to get out of bed, walk around indoors and cough well. You may stop using the incentive spirometer when instructed by your caregiver.  RISKS AND COMPLICATIONS  Take your time so you do not get dizzy or light-headed.  If you are in pain, you may need to take or ask for pain medication before doing incentive spirometry. It is harder to take a deep breath if you are having pain. AFTER USE  Rest and breathe slowly and easily.  It can be helpful to keep track of a log of your progress. Your caregiver can provide you with a simple table to help with this. If you are using the spirometer at home, follow these instructions: [place] IF:   You are having difficultly using the spirometer.  You have trouble using the spirometer as often as instructed.  Your pain medication is not giving enough relief while using the spirometer.  You develop fever of 100.5 F (38.1 C) or higher. SEEK IMMEDIATE MEDICAL CARE IF:   You cough up bloody sputum that had not been present before.  You develop fever of 102 F (38.9 C) or greater.  You develop worsening pain at or near the incision site. MAKE SURE YOU:   Understand these instructions.  Will watch your condition.  Will get help right away if you are not doing well or get worse. Document Released: 08/01/2006 Document Revised: 06/13/2011 Document Reviewed: 10/02/2006 [facility] Patient Information 2014 [place], [place].   ________________________________________________________________________

## 2020-08-14 ENCOUNTER — Other Ambulatory Visit: Payer: Self-pay

## 2020-08-14 ENCOUNTER — Encounter (HOSPITAL_COMMUNITY): Payer: Self-pay

## 2020-08-14 ENCOUNTER — Encounter (HOSPITAL_COMMUNITY)
Admission: RE | Admit: 2020-08-14 | Discharge: 2020-08-14 | Disposition: A | Discharge status: Home / Self Care | Payer: Medicare Other | Source: Ambulatory Visit | Attending: Orthopaedic Surgery | Admitting: Orthopaedic Surgery

## 2020-08-14 DIAGNOSIS — Z01818 Encounter for other preprocedural examination: Secondary | ICD-10-CM | POA: Insufficient documentation

## 2020-08-14 HISTORY — DX: Unspecified osteoarthritis, unspecified site: M19.90

## 2020-08-14 HISTORY — DX: Type 2 diabetes mellitus without complications: E11.9

## 2020-08-14 HISTORY — DX: Essential (primary) hypertension: I10

## 2020-08-14 HISTORY — DX: Depression, unspecified: F32.A

## 2020-08-14 LAB — CBC
HCT: 40.2 % (ref 36.0–46.0)
Hemoglobin: 13.6 g/dL (ref 12.0–15.0)
MCH: 31.6 pg (ref 26.0–34.0)
MCHC: 33.8 g/dL (ref 30.0–36.0)
MCV: 93.3 fL (ref 80.0–100.0)
Platelets: 275 10*3/uL (ref 150–400)
RBC: 4.31 MIL/uL (ref 3.87–5.11)
RDW: 12.4 % (ref 11.5–15.5)
WBC: 6.9 10*3/uL (ref 4.0–10.5)
nRBC: 0 % (ref 0.0–0.2)

## 2020-08-14 LAB — BASIC METABOLIC PANEL
Anion gap: 8 (ref 5–15)
BUN: 22 mg/dL (ref 8–23)
CO2: 28 mmol/L (ref 22–32)
Calcium: 9.9 mg/dL (ref 8.9–10.3)
Chloride: 102 mmol/L (ref 98–111)
Creatinine, Ser: 1.09 mg/dL — ABNORMAL HIGH (ref 0.44–1.00)
GFR, Estimated: 54 mL/min — ABNORMAL LOW (ref >60)
Glucose, Bld: 114 mg/dL — ABNORMAL HIGH (ref 70–99)
Potassium: 3.3 mmol/L — ABNORMAL LOW (ref 3.5–5.1)
Sodium: 138 mmol/L (ref 135–145)

## 2020-08-14 LAB — SURGICAL PCR SCREEN
MRSA, PCR: NEGATIVE
Staphylococcus aureus: NEGATIVE

## 2020-08-14 LAB — GLUCOSE, CAPILLARY: Glucose-Capillary: 122 mg/dL — ABNORMAL HIGH (ref 70–99)

## 2020-08-14 NOTE — Progress Notes (Signed)
COVID Vaccine Completed: Yes Date COVID Vaccine completed: 07/16/20 [place] COVID vaccine manufacturer: [place]     PCP - dr. Wenda Low Cardiologist -   Chest x-ray -  EKG -  Stress Test -  ECHO -  Cardiac Cath -  Pacemaker/ICD device last checked: A1-c: 5.9 @ 06/24/20 Sleep Study -  CPAP -   Fasting Blood Sugar - 120's-130's Checks Blood Sugar __2___ times a month Blood Thinner Instructions: Aspirin Instructions: Last Dose:  Anesthesia review: Hx: DIA,HTN  Patient denies shortness of breath, fever, cough and chest pain at PAT appointment   Patient verbalized understanding of instructions that were given to them at the PAT appointment. Patient was also instructed that they will need to review over the PAT instructions again at home before surgery.

## 2020-08-18 ENCOUNTER — Other Ambulatory Visit (HOSPITAL_COMMUNITY)
Admission: RE | Admit: 2020-08-18 | Discharge: 2020-08-18 | Disposition: A | Discharge status: Home / Self Care | Payer: Medicare Other | Source: Ambulatory Visit | Attending: Orthopaedic Surgery | Admitting: Orthopaedic Surgery

## 2020-08-18 DIAGNOSIS — Z20822 Contact with and (suspected) exposure to covid-19: Secondary | ICD-10-CM | POA: Insufficient documentation

## 2020-08-18 DIAGNOSIS — Z01812 Encounter for preprocedural laboratory examination: Secondary | ICD-10-CM | POA: Diagnosis present

## 2020-08-19 LAB — SARS CORONAVIRUS 2 (TAT 6-24 HRS): SARS Coronavirus 2: NEGATIVE

## 2020-08-20 NOTE — Anesthesia Preprocedure Evaluation (Addendum)
Anesthesia Evaluation  Patient identified by MRN, date of birth, ID band Patient awake    Reviewed: Allergy & Precautions, NPO status , Patient's Chart, lab work & pertinent test results  Airway Mallampati: III  TM Distance: >3 FB Neck ROM: Full    Dental no notable dental hx.    Pulmonary neg pulmonary ROS,    Pulmonary exam normal breath sounds clear to auscultation       Cardiovascular hypertension, Pt. on medications Normal cardiovascular exam Rhythm:Regular Rate:Normal  ECG: NSR, rate 78   Neuro/Psych PSYCHIATRIC DISORDERS Depression negative neurological ROS     GI/Hepatic negative GI ROS, Neg liver ROS,   Endo/Other  diabetes, Oral Hypoglycemic Agents  Renal/GU negative Renal ROS     Musculoskeletal  (+) Arthritis , Osteoarthritis,    Abdominal   Peds  Hematology HLD   Anesthesia Other Findings Osteoarthritis Left Hip  Reproductive/Obstetrics                            Anesthesia Physical Anesthesia Plan  ASA: II  Anesthesia Plan: Spinal   Post-op Pain Management:    Induction: Intravenous  PONV Risk Score and Plan: 2 and Ondansetron, Dexamethasone, Propofol infusion and Treatment may vary due to age or medical condition  Airway Management Planned: Simple Face Mask  Additional Equipment:   Intra-op Plan:   Post-operative Plan:   Informed Consent: I have reviewed the patients History and Physical, chart, labs and discussed the procedure including the risks, benefits and alternatives for the proposed anesthesia with the patient or authorized representative who has indicated his/her understanding and acceptance.     Dental advisory given  Plan Discussed with: CRNA  Anesthesia Plan Comments:         Anesthesia Quick Evaluation

## 2020-08-20 NOTE — H&P (Signed)
TOTAL HIP ADMISSION H&P  Patient is admitted for left total hip arthroplasty.  Subjective:  Chief Complaint: left hip pain  HPI: Cindy Lambert, 75 y.o. female, has a history of pain and functional disability in the left hip(s) due to arthritis and patient has failed non-surgical conservative treatments for greater than 12 weeks to include NSAID's and/or analgesics, flexibility and strengthening excercises and activity modification.  Onset of symptoms was gradual starting 4 years ago with gradually worsening course since that time.The patient noted no past surgery on the left hip(s).  Patient currently rates pain in the left hip at 9 out of 10 with activity. Patient has night pain, worsening of pain with activity and weight bearing, pain that interfers with activities of daily living and pain with passive range of motion. Patient has evidence of subchondral cysts, subchondral sclerosis, periarticular osteophytes and joint space narrowing by imaging studies. This condition presents safety issues increasing the risk of falls.  There is no current active infection.  Patient Active Problem List   Diagnosis Date Noted  . Unilateral primary osteoarthritis, left hip 07/13/2020   Past Medical History:  Diagnosis Date  . Arthritis   . Depression   . Diabetes mellitus without complication ([place])   . Hypertension     Past Surgical History:  Procedure Laterality Date  . ABDOMINAL EXPLORATION SURGERY    . APPENDECTOMY    . BUNIONECTOMY Bilateral     No current facility-administered medications for this encounter.   Current Outpatient Medications  Medication Sig Dispense Refill Last Dose  . acidophilus (RISAQUAD) CAPS capsule Take 1 capsule by mouth daily.     . APPLE CIDER VINEGAR PO Take 1 capsule by mouth in the morning and at bedtime.     Marland Kitchen atorvastatin (LIPITOR) 20 MG tablet Take 20 mg by mouth every evening.     . Calcium Carbonate-Vitamin D 600-200 MG-UNIT TABS Take 1 tablet by mouth in the  morning and at bedtime.     . citalopram (CELEXA) 20 MG tablet Take 20 mg by mouth in the morning.     . clonazePAM (KLONOPIN) 0.5 MG tablet Take 0.5 mg by mouth at bedtime.     . Fe Fum-FePoly-Vit C-Vit B3 (INTEGRA) 62.5-62.5-40-3 MG CAPS Take 1 tablet by mouth daily.     Marland Kitchen losartan (COZAAR) 50 MG tablet Take 50 mg by mouth in the morning and at bedtime. (Patient not taking: Reported on 08/14/2020)     . metFORMIN (GLUCOPHAGE) 500 MG tablet Take 500 mg by mouth daily with breakfast.     . omeprazole (PRILOSEC) 40 MG capsule Take 40 mg by mouth in the morning.     . traZODone (DESYREL) 150 MG tablet Take 150 mg by mouth at bedtime.     Marland Kitchen losartan-hydrochlorothiazide (HYZAAR) 100-12.5 MG tablet Take 1 tablet by mouth daily.      Allergies  Allergen Reactions  . Latex Swelling    Social History   Tobacco Use  . Smoking status: Never Smoker  . Smokeless tobacco: Never Used  Substance Use Topics  . Alcohol use: Never    No family history on file.   Review of Systems  Musculoskeletal: Positive for gait problem.  All other systems reviewed and are negative.   Objective:  Physical Exam Vitals reviewed.  Constitutional:      Appearance: Normal appearance.  HENT:     Head: Normocephalic and atraumatic.  Eyes:     Extraocular Movements: Extraocular movements intact.  Cardiovascular:  Rate and Rhythm: Normal rate and regular rhythm.     Pulses: Normal pulses.  Pulmonary:     Effort: Pulmonary effort is normal.     Breath sounds: Normal breath sounds.  Abdominal:     Palpations: Abdomen is soft.  Musculoskeletal:     Cervical back: Normal range of motion and neck supple.     Left hip: Tenderness and bony tenderness present. Decreased range of motion. Decreased strength.  Neurological:     Mental Status: She is alert and oriented to person, place, and time.  Psychiatric:        Behavior: Behavior normal.     Vital signs in last 24 hours:    Labs:   Estimated body  mass index is 27.76 kg/m as calculated from the following:   Height as of 08/14/20: 5\' 6"  (1.676 m).   Weight as of 08/14/20: 78 kg.   Imaging Review Plain radiographs demonstrate severe degenerative joint disease of the left hip(s). The bone quality appears to be excellent for age and reported activity level.      Assessment/Plan:  End stage arthritis, left hip(s)  The patient history, physical examination, clinical judgement of the provider and imaging studies are consistent with end stage degenerative joint disease of the left hip(s) and total hip arthroplasty is deemed medically necessary. The treatment options including medical management, injection therapy, arthroscopy and arthroplasty were discussed at length. The risks and benefits of total hip arthroplasty were presented and reviewed. The risks due to aseptic loosening, infection, stiffness, dislocation/subluxation,  thromboembolic complications and other imponderables were discussed.  The patient acknowledged the explanation, agreed to proceed with the plan and consent was signed. Patient is being admitted for inpatient treatment for surgery, pain control, PT, OT, prophylactic antibiotics, VTE prophylaxis, progressive ambulation and ADL's and discharge planning.The patient is planning to be discharged home with home health services

## 2020-08-21 ENCOUNTER — Ambulatory Visit (HOSPITAL_COMMUNITY): Payer: Medicare Other

## 2020-08-21 ENCOUNTER — Encounter (HOSPITAL_COMMUNITY)
Admission: RE | Disposition: A | Discharge status: Home / Self Care | Payer: Self-pay | Source: Home / Self Care | Attending: Orthopaedic Surgery

## 2020-08-21 ENCOUNTER — Ambulatory Visit (HOSPITAL_COMMUNITY): Payer: Medicare Other | Admitting: Anesthesiology

## 2020-08-21 ENCOUNTER — Other Ambulatory Visit: Payer: Self-pay

## 2020-08-21 ENCOUNTER — Observation Stay (HOSPITAL_COMMUNITY)
Admission: RE | Admit: 2020-08-21 | Discharge: 2020-08-22 | Disposition: A | Discharge status: Home / Self Care | Payer: Medicare Other | Attending: Orthopaedic Surgery | Admitting: Orthopaedic Surgery

## 2020-08-21 ENCOUNTER — Encounter (HOSPITAL_COMMUNITY): Payer: Self-pay | Admitting: Orthopaedic Surgery

## 2020-08-21 ENCOUNTER — Observation Stay (HOSPITAL_COMMUNITY): Payer: Medicare Other

## 2020-08-21 DIAGNOSIS — M1612 Unilateral primary osteoarthritis, left hip: Secondary | ICD-10-CM | POA: Diagnosis not present

## 2020-08-21 DIAGNOSIS — S82122A Displaced fracture of lateral condyle of left tibia, initial encounter for closed fracture: Secondary | ICD-10-CM

## 2020-08-21 DIAGNOSIS — Z91040 Latex allergy status: Secondary | ICD-10-CM | POA: Insufficient documentation

## 2020-08-21 DIAGNOSIS — E119 Type 2 diabetes mellitus without complications: Secondary | ICD-10-CM | POA: Insufficient documentation

## 2020-08-21 DIAGNOSIS — Z79899 Other long term (current) drug therapy: Secondary | ICD-10-CM | POA: Insufficient documentation

## 2020-08-21 DIAGNOSIS — I10 Essential (primary) hypertension: Secondary | ICD-10-CM | POA: Diagnosis not present

## 2020-08-21 DIAGNOSIS — Z96642 Presence of left artificial hip joint: Secondary | ICD-10-CM

## 2020-08-21 DIAGNOSIS — Z7984 Long term (current) use of oral hypoglycemic drugs: Secondary | ICD-10-CM | POA: Insufficient documentation

## 2020-08-21 HISTORY — PX: TOTAL HIP ARTHROPLASTY: SHX124

## 2020-08-21 LAB — GLUCOSE, CAPILLARY
Glucose-Capillary: 136 mg/dL — ABNORMAL HIGH (ref 70–99)
Glucose-Capillary: 144 mg/dL — ABNORMAL HIGH (ref 70–99)

## 2020-08-21 LAB — TYPE AND SCREEN
ABO/RH(D): O POS
Antibody Screen: NEGATIVE

## 2020-08-21 LAB — ABO/RH: ABO/RH(D): O POS

## 2020-08-21 SURGERY — ARTHROPLASTY, HIP, TOTAL, ANTERIOR APPROACH
Anesthesia: Spinal | Site: Hip | Laterality: Left

## 2020-08-21 MED ORDER — LACTATED RINGERS IV SOLN: INTRAVENOUS | Status: DC

## 2020-08-21 MED ORDER — LOSARTAN POTASSIUM 50 MG PO TABS: 50.0000 mg | ORAL_TABLET | Freq: Every day | ORAL | Status: DC

## 2020-08-21 MED ORDER — 0.9 % SODIUM CHLORIDE (POUR BTL) OPTIME
TOPICAL | Status: DC | PRN
  Administered 2020-08-21: 1000 mL

## 2020-08-21 MED ORDER — MENTHOL 3 MG MT LOZG
1.0000 | LOZENGE | OROMUCOSAL | Status: DC | PRN
  Administered 2020-08-21: 3 mg via ORAL
  Filled 2020-08-21: qty 9

## 2020-08-21 MED ORDER — FENTANYL CITRATE (PF) 100 MCG/2ML IJ SOLN
25.0000 ug | INTRAMUSCULAR | Status: DC | PRN
  Administered 2020-08-21 (×2): 25 ug via INTRAVENOUS

## 2020-08-21 MED ORDER — ONDANSETRON HCL 4 MG/2ML IJ SOLN: 4.0000 mg | Freq: Once | INTRAMUSCULAR | Status: DC | PRN

## 2020-08-21 MED ORDER — FENTANYL CITRATE (PF) 100 MCG/2ML IJ SOLN
25.0000 ug | INTRAMUSCULAR | Status: DC | PRN
  Administered 2020-08-21: 25 ug via INTRAVENOUS

## 2020-08-21 MED ORDER — DOCUSATE SODIUM 100 MG PO CAPS
100.0000 mg | ORAL_CAPSULE | Freq: Two times a day (BID) | ORAL | Status: DC
  Administered 2020-08-21 – 2020-08-22 (×3): 100 mg via ORAL
  Filled 2020-08-21 (×3): qty 1

## 2020-08-21 MED ORDER — HYDROMORPHONE HCL 1 MG/ML IJ SOLN: 0.5000 mg | INTRAMUSCULAR | Status: DC | PRN

## 2020-08-21 MED ORDER — DIPHENHYDRAMINE HCL 12.5 MG/5ML PO ELIX: 12.5000 mg | ORAL_SOLUTION | ORAL | Status: DC | PRN

## 2020-08-21 MED ORDER — PROPOFOL 500 MG/50ML IV EMUL
INTRAVENOUS | Status: DC | PRN
  Administered 2020-08-21: 100 ug/kg/min via INTRAVENOUS

## 2020-08-21 MED ORDER — METOCLOPRAMIDE HCL 5 MG PO TABS: 5.0000 mg | ORAL_TABLET | Freq: Three times a day (TID) | ORAL | Status: DC | PRN

## 2020-08-21 MED ORDER — DEXAMETHASONE SODIUM PHOSPHATE 10 MG/ML IJ SOLN
INTRAMUSCULAR | Status: AC
  Filled 2020-08-21: qty 1

## 2020-08-21 MED ORDER — CLONAZEPAM 0.5 MG PO TABS
0.5000 mg | ORAL_TABLET | Freq: Every day | ORAL | Status: DC
  Administered 2020-08-21: 0.5 mg via ORAL
  Filled 2020-08-21: qty 1

## 2020-08-21 MED ORDER — HYDROCHLOROTHIAZIDE 12.5 MG PO CAPS
12.5000 mg | ORAL_CAPSULE | Freq: Every day | ORAL | Status: DC
  Administered 2020-08-22: 12.5 mg via ORAL
  Filled 2020-08-21: qty 1

## 2020-08-21 MED ORDER — METFORMIN HCL 500 MG PO TABS
500.0000 mg | ORAL_TABLET | Freq: Every day | ORAL | Status: DC
  Administered 2020-08-22: 500 mg via ORAL
  Filled 2020-08-21: qty 1

## 2020-08-21 MED ORDER — INTEGRA 62.5-62.5-40-3 MG PO CAPS: 1.0000 | ORAL_CAPSULE | Freq: Every day | ORAL | Status: DC

## 2020-08-21 MED ORDER — AMISULPRIDE (ANTIEMETIC) 5 MG/2ML IV SOLN: 10.0000 mg | Freq: Once | INTRAVENOUS | Status: DC | PRN

## 2020-08-21 MED ORDER — ASPIRIN 81 MG PO CHEW
81.0000 mg | CHEWABLE_TABLET | Freq: Two times a day (BID) | ORAL | Status: DC
  Administered 2020-08-21 – 2020-08-22 (×2): 81 mg via ORAL
  Filled 2020-08-21 (×2): qty 1

## 2020-08-21 MED ORDER — TAB-A-VITE/IRON PO TABS
1.0000 | ORAL_TABLET | Freq: Every day | ORAL | Status: DC
  Administered 2020-08-21 – 2020-08-22 (×2): 1 via ORAL
  Filled 2020-08-21 (×2): qty 1

## 2020-08-21 MED ORDER — STERILE WATER FOR IRRIGATION IR SOLN
Status: DC | PRN
  Administered 2020-08-21: 2000 mL

## 2020-08-21 MED ORDER — SODIUM CHLORIDE 0.9 % IV SOLN: INTRAVENOUS | Status: DC

## 2020-08-21 MED ORDER — DEXAMETHASONE SODIUM PHOSPHATE 10 MG/ML IJ SOLN
INTRAMUSCULAR | Status: DC | PRN
  Administered 2020-08-21: 10 mg via INTRAVENOUS

## 2020-08-21 MED ORDER — EPHEDRINE SULFATE-NACL 50-0.9 MG/10ML-% IV SOSY
PREFILLED_SYRINGE | INTRAVENOUS | Status: DC | PRN
  Administered 2020-08-21: 10 mg via INTRAVENOUS

## 2020-08-21 MED ORDER — RISAQUAD PO CAPS
1.0000 | ORAL_CAPSULE | Freq: Every day | ORAL | Status: DC
  Administered 2020-08-21 – 2020-08-22 (×2): 1 via ORAL
  Filled 2020-08-21 (×2): qty 1

## 2020-08-21 MED ORDER — FENTANYL CITRATE (PF) 100 MCG/2ML IJ SOLN
INTRAMUSCULAR | Status: AC
  Administered 2020-08-21: 25 ug via INTRAVENOUS
  Filled 2020-08-21: qty 2

## 2020-08-21 MED ORDER — POVIDONE-IODINE 10 % EX SWAB
2.0000 "application " | Freq: Once | CUTANEOUS | Status: AC
  Administered 2020-08-21: 2 via TOPICAL

## 2020-08-21 MED ORDER — ACETAMINOPHEN 500 MG PO TABS
1000.0000 mg | ORAL_TABLET | Freq: Once | ORAL | Status: AC
  Administered 2020-08-21: 1000 mg via ORAL
  Filled 2020-08-21: qty 2

## 2020-08-21 MED ORDER — POLYETHYLENE GLYCOL 3350 17 G PO PACK: 17.0000 g | PACK | Freq: Every day | ORAL | Status: DC | PRN

## 2020-08-21 MED ORDER — LOSARTAN POTASSIUM-HCTZ 100-12.5 MG PO TABS: 1.0000 | ORAL_TABLET | Freq: Every day | ORAL | Status: DC

## 2020-08-21 MED ORDER — MIDAZOLAM HCL 5 MG/5ML IJ SOLN
INTRAMUSCULAR | Status: DC | PRN
  Administered 2020-08-21: 2 mg via INTRAVENOUS

## 2020-08-21 MED ORDER — ALUM & MAG HYDROXIDE-SIMETH 200-200-20 MG/5ML PO SUSP: 30.0000 mL | ORAL | Status: DC | PRN

## 2020-08-21 MED ORDER — CHLORHEXIDINE GLUCONATE 0.12 % MT SOLN
15.0000 mL | Freq: Once | OROMUCOSAL | Status: AC
  Administered 2020-08-21: 15 mL via OROMUCOSAL

## 2020-08-21 MED ORDER — ACETAMINOPHEN 325 MG PO TABS: 325.0000 mg | ORAL_TABLET | Freq: Four times a day (QID) | ORAL | Status: DC | PRN

## 2020-08-21 MED ORDER — ORAL CARE MOUTH RINSE: 15.0000 mL | Freq: Once | OROMUCOSAL | Status: AC

## 2020-08-21 MED ORDER — ONDANSETRON HCL 4 MG/2ML IJ SOLN: 4.0000 mg | Freq: Four times a day (QID) | INTRAMUSCULAR | Status: DC | PRN

## 2020-08-21 MED ORDER — CITALOPRAM HYDROBROMIDE 20 MG PO TABS
20.0000 mg | ORAL_TABLET | Freq: Every morning | ORAL | Status: DC
  Administered 2020-08-22: 20 mg via ORAL
  Filled 2020-08-21: qty 1

## 2020-08-21 MED ORDER — METHOCARBAMOL 500 MG PO TABS
500.0000 mg | ORAL_TABLET | Freq: Four times a day (QID) | ORAL | Status: DC | PRN
  Administered 2020-08-21 – 2020-08-22 (×2): 500 mg via ORAL
  Filled 2020-08-21 (×2): qty 1

## 2020-08-21 MED ORDER — CEFAZOLIN SODIUM-DEXTROSE 1-4 GM/50ML-% IV SOLN
1.0000 g | Freq: Four times a day (QID) | INTRAVENOUS | Status: AC
  Administered 2020-08-21 (×2): 1 g via INTRAVENOUS
  Filled 2020-08-21 (×3): qty 50

## 2020-08-21 MED ORDER — PROPOFOL 1000 MG/100ML IV EMUL
INTRAVENOUS | Status: AC
  Filled 2020-08-21: qty 100

## 2020-08-21 MED ORDER — METHOCARBAMOL 500 MG IVPB - SIMPLE MED
INTRAVENOUS | Status: AC
  Filled 2020-08-21: qty 50

## 2020-08-21 MED ORDER — METHOCARBAMOL 500 MG IVPB - SIMPLE MED
500.0000 mg | Freq: Four times a day (QID) | INTRAVENOUS | Status: DC | PRN
  Administered 2020-08-21: 500 mg via INTRAVENOUS
  Filled 2020-08-21: qty 50

## 2020-08-21 MED ORDER — OXYCODONE HCL 5 MG PO TABS
10.0000 mg | ORAL_TABLET | ORAL | Status: DC | PRN
  Administered 2020-08-21 (×2): 15 mg via ORAL
  Administered 2020-08-21: 10 mg via ORAL
  Administered 2020-08-22 (×2): 15 mg via ORAL
  Filled 2020-08-21 (×4): qty 3

## 2020-08-21 MED ORDER — FENTANYL CITRATE (PF) 100 MCG/2ML IJ SOLN
INTRAMUSCULAR | Status: AC
  Filled 2020-08-21: qty 2

## 2020-08-21 MED ORDER — OXYCODONE HCL 5 MG PO TABS
5.0000 mg | ORAL_TABLET | ORAL | Status: DC | PRN
  Filled 2020-08-21: qty 2

## 2020-08-21 MED ORDER — CEFAZOLIN SODIUM-DEXTROSE 2-4 GM/100ML-% IV SOLN
2.0000 g | INTRAVENOUS | Status: AC
  Administered 2020-08-21: 2 g via INTRAVENOUS
  Filled 2020-08-21: qty 100

## 2020-08-21 MED ORDER — FENTANYL CITRATE (PF) 100 MCG/2ML IJ SOLN
INTRAMUSCULAR | Status: DC | PRN
  Administered 2020-08-21: 100 ug via INTRAVENOUS

## 2020-08-21 MED ORDER — PHENOL 1.4 % MT LIQD: 1.0000 | OROMUCOSAL | Status: DC | PRN

## 2020-08-21 MED ORDER — LOSARTAN POTASSIUM 50 MG PO TABS
100.0000 mg | ORAL_TABLET | Freq: Every day | ORAL | Status: DC
  Administered 2020-08-22: 100 mg via ORAL
  Filled 2020-08-21: qty 2

## 2020-08-21 MED ORDER — PANTOPRAZOLE SODIUM 40 MG PO TBEC
40.0000 mg | DELAYED_RELEASE_TABLET | Freq: Every day | ORAL | Status: DC
  Administered 2020-08-21 – 2020-08-22 (×2): 40 mg via ORAL
  Filled 2020-08-21 (×2): qty 1

## 2020-08-21 MED ORDER — ONDANSETRON HCL 4 MG/2ML IJ SOLN
INTRAMUSCULAR | Status: DC | PRN
  Administered 2020-08-21: 4 mg via INTRAVENOUS

## 2020-08-21 MED ORDER — ONDANSETRON HCL 4 MG/2ML IJ SOLN
INTRAMUSCULAR | Status: AC
  Filled 2020-08-21: qty 2

## 2020-08-21 MED ORDER — MIDAZOLAM HCL 2 MG/2ML IJ SOLN
INTRAMUSCULAR | Status: AC
  Filled 2020-08-21: qty 2

## 2020-08-21 MED ORDER — METOCLOPRAMIDE HCL 5 MG/ML IJ SOLN
5.0000 mg | Freq: Three times a day (TID) | INTRAMUSCULAR | Status: DC | PRN
Start: 2020-08-21 — End: 2020-08-22

## 2020-08-21 MED ORDER — ATORVASTATIN CALCIUM 20 MG PO TABS
20.0000 mg | ORAL_TABLET | Freq: Every evening | ORAL | Status: DC
  Administered 2020-08-21: 20 mg via ORAL
  Filled 2020-08-21: qty 1

## 2020-08-21 MED ORDER — SODIUM CHLORIDE 0.9 % IR SOLN
Status: DC | PRN
  Administered 2020-08-21: 1000 mL

## 2020-08-21 MED ORDER — ONDANSETRON HCL 4 MG PO TABS: 4.0000 mg | ORAL_TABLET | Freq: Four times a day (QID) | ORAL | Status: DC | PRN

## 2020-08-21 SURGICAL SUPPLY — 39 items
BAG ZIPLOCK 12X15 (MISCELLANEOUS) IMPLANT
BENZOIN TINCTURE PRP APPL 2/3 (GAUZE/BANDAGES/DRESSINGS) IMPLANT
BLADE SAW SGTL 18X1.27X75 (BLADE) ×2 IMPLANT
COVER PERINEAL POST (MISCELLANEOUS) ×2 IMPLANT
COVER SURGICAL LIGHT HANDLE (MISCELLANEOUS) ×2 IMPLANT
COVER WAND RF STERILE (DRAPES) ×2 IMPLANT
CUP SECTOR GRIPTON 50MM (Cup) ×2 IMPLANT
DRAPE STERI IOBAN 125X83 (DRAPES) ×2 IMPLANT
DRAPE U-SHAPE 47X51 STRL (DRAPES) ×4 IMPLANT
DRSG AQUACEL AG ADV 3.5X10 (GAUZE/BANDAGES/DRESSINGS) ×2 IMPLANT
DURAPREP 26ML APPLICATOR (WOUND CARE) ×2 IMPLANT
ELECT REM PT RETURN 15FT ADLT (MISCELLANEOUS) ×2 IMPLANT
GAUZE XEROFORM 1X8 LF (GAUZE/BANDAGES/DRESSINGS) ×2 IMPLANT
GLOVE SRG 8 PF TXTR STRL LF DI (GLOVE) ×2 IMPLANT
GLOVE SURG ENC MOIS LTX SZ7.5 (GLOVE) ×2 IMPLANT
GLOVE SURG LTX SZ8 (GLOVE) ×2 IMPLANT
GLOVE SURG UNDER POLY LF SZ8 (GLOVE) ×2
GOWN STRL REUS W/TWL XL LVL3 (GOWN DISPOSABLE) ×4 IMPLANT
HANDPIECE INTERPULSE COAX TIP (DISPOSABLE) ×1
HEAD FEM STD 32X+1 STRL (Hips) ×2 IMPLANT
HOLDER FOLEY CATH W/STRAP (MISCELLANEOUS) ×2 IMPLANT
KIT TURNOVER KIT A (KITS) ×2 IMPLANT
LINER ACETABULAR 32X50 (Liner) ×2 IMPLANT
PACK ANTERIOR HIP CUSTOM (KITS) ×2 IMPLANT
PENCIL SMOKE EVACUATOR (MISCELLANEOUS) IMPLANT
SET HNDPC FAN SPRY TIP SCT (DISPOSABLE) ×1 IMPLANT
STAPLER VISISTAT 35W (STAPLE) ×2 IMPLANT
STEM CORAIL KA09 (Stem) ×2 IMPLANT
STRIP CLOSURE SKIN 1/2X4 (GAUZE/BANDAGES/DRESSINGS) IMPLANT
SUT ETHIBOND NAB CT1 #1 30IN (SUTURE) ×2 IMPLANT
SUT ETHILON 2 0 PS N (SUTURE) IMPLANT
SUT MNCRL AB 4-0 PS2 18 (SUTURE) IMPLANT
SUT VIC AB 0 CT1 36 (SUTURE) ×2 IMPLANT
SUT VIC AB 1 CT1 36 (SUTURE) ×2 IMPLANT
SUT VIC AB 2-0 CT1 27 (SUTURE) ×2
SUT VIC AB 2-0 CT1 TAPERPNT 27 (SUTURE) ×2 IMPLANT
TRAY FOL W/BAG SLVR 16FR STRL (SET/KITS/TRAYS/PACK) ×1 IMPLANT
TRAY FOLEY MTR SLVR 16FR STAT (SET/KITS/TRAYS/PACK) IMPLANT
TRAY FOLEY W/BAG SLVR 16FR LF (SET/KITS/TRAYS/PACK) ×1

## 2020-08-21 NOTE — Anesthesia Postprocedure Evaluation (Signed)
Anesthesia Post Note  Patient: Elmer Boutelle  Procedure(s) Performed: LEFT TOTAL HIP ARTHROPLASTY ANTERIOR APPROACH (Left Hip)     Patient location during evaluation: PACU Anesthesia Type: Spinal Level of consciousness: awake Pain management: pain level controlled Vital Signs Assessment: post-procedure vital signs reviewed and stable Respiratory status: spontaneous breathing, respiratory function stable and patient connected to nasal cannula oxygen Cardiovascular status: blood pressure returned to baseline and stable Postop Assessment: no headache, no backache and no apparent nausea or vomiting Anesthetic complications: no   No complications documented.  Last Vitals:  Vitals:   08/21/20 1205 08/21/20 1358  BP: 130/74 135/62  Pulse: 65 75  Resp: 18 16  Temp: (!) 36.4 C (!) 36.4 C  SpO2: 96% 93%    Last Pain:  Vitals:   08/21/20 1704  TempSrc:   PainSc: Asleep                 Shalla Bulluck P Aideliz Garmany

## 2020-08-21 NOTE — Brief Op Note (Signed)
08/21/2020  10:08 AM  PATIENT:  Cindy Lambert  75 y.o. female  PRE-OPERATIVE DIAGNOSIS:  Osteoarthritis Left Hip  POST-OPERATIVE DIAGNOSIS:  Osteoarthritis Left Hip  PROCEDURE:  Procedure(s): LEFT TOTAL HIP ARTHROPLASTY ANTERIOR APPROACH (Left)  SURGEON:  Surgeon(s) and Role:    Mcarthur Rossetti, MD - Primary  PHYSICIAN ASSISTANT:  Benita Stabile, PA-C  ANESTHESIA:   spinal  EBL:  250 mL   COUNTS:  YES  DICTATION: .Other Dictation: Dictation Number [id number]  PLAN OF CARE: Admit for overnight observation  PATIENT DISPOSITION:  PACU - hemodynamically stable.   Delay start of Pharmacological VTE agent (>24hrs) due to surgical blood loss or risk of bleeding: no

## 2020-08-21 NOTE — Op Note (Signed)
NAMEELLIEANA, DOLECKI MEDICAL RECORD NO: [id number] ACCOUNT NO: [id number] DATE OF BIRTH: Apr 18, 1945 FACILITY: [facility]ATION: [id number] PHYSICIAN: Lind Guest. Ninfa Linden, MD  Operative Report   DATE OF PROCEDURE: 08/21/2020  PREOPERATIVE DIAGNOSIS:  Primary osteoarthritis and degenerative joint disease, left hip.  POSTOPERATIVE DIAGNOSIS:  Primary osteoarthritis and degenerative joint disease, left hip.  PROCEDURE:  Left total hip arthroplasty through a direct anterior approach.  IMPLANTS:  DePuy Sector Gription acetabular component size 50, size 32+0 neutral polyethylene liner, size 9 Corail femoral component with standard offset, size 32+1 metal hip ball.  SURGEON:  Lind Guest. Ninfa Linden, MD  ASSISTANT:  Erskine Emery, PA-C.  ANESTHESIA:  Spinal.  ESTIMATED BLOOD LOSS:  250 mL  ANTIBIOTICS:  2 grams IV Ancef.  COMPLICATIONS:  None.  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  She has put off surgery now until retirement.  At this point, her left hip pain is daily and is detrimentally affecting her mobility,  her quality of life and activities of daily living.  She has had a round of conservative treatment and that has not helped.  Her x-ray show severe arthritis in the left hip and in correlation with her clinical exam.  At this point, we have recommended  total hip arthroplasty surgery.  We had a long and thorough discussion about the risk of acute blood loss anemia, nerve or vessel injury, fracture, infection, DVT, dislocation and implant failure as well as skin and soft tissue issues.  We talked about  our goals being decreased pain, improved mobility and overall improved quality of life.  DESCRIPTION OF PROCEDURE:  After informed consent was obtained, and appropriate left hip was marked.  She was brought to the operating room and sat up on the stretcher where spinal anesthesia was obtained, she was laid  in supine position on the  stretcher.  A Foley catheter was placed to assess her leg length and she certainly shorter on the left operative side than the right.  Traction boots were then placed on both her feet and she was placed supine on the Hana fracture table with the perineal  post in place and both legs in the in line skeletal traction device and no traction applied.  Her left operative hip was prepped and draped with DuraPrep and sterile drapes.  A timeout was called and she was identified correct patient, correct left hip.   I then made an incision just inferior and posterior to the anterior superior iliac spine and carried this obliquely down the leg.  We dissected down tensor fascia lata muscle.  Tensor fascia was then divided longitudinally to proceed with a direct  anterior approach to the hip.  We identified and cauterized circumflex vessels, identified the hip capsule, opened the hip capsule in L-type format finding moderate joint effusion and significant periarticular osteophytes around the lateral femoral head  and neck.  We placed curved retractors around the medial and lateral femoral neck within the joint capsule and made a femoral neck cut with an oscillating saw just proximal to the lesser trochanter and completed this with osteotome.  We placed a  corkscrew guide in the femoral head and removed the femoral head in its entirety and found a wide area devoid of cartilage.  We then placed a bent Hohmann over the medial acetabular rim and removed remnants of acetabular labrum and other debris.  We then  began reaming under direct visualization from  a size 43 reamer in stepwise increments going up to a size 49, with all reamers placed under direct visualization, the last reamer was placed under direct fluoroscopy, so we could obtain our depth of reaming  our inclination and anteversion.  I then placed the real DePuy Sector Gription acetabular component size 50 and a 32+0 neutral polyethylene  liner for that size acetabular component.  Attention was then turned to the femur with the leg externally rotated  120 degrees, extended and adducted we were able to place the Mueller retractor medially and a Hohmann retractor behind the greater trochanter.  We released lateral joint capsule and used a box cutting osteotome to enter the femoral canal and a rongeur  to lateralize.  I then began broaching using the Corail broaching system from a size 8 going up to only a size 9.  With a size 9 in place we tried a standard offset femoral neck and a 32+1 hip ball, reduced this in the acetabulum.  We felt like she was a  little bit long on that side, but her offsets were good.  We medialized the cup enough and we had shortest hip ball.  I tried to take the stem out a little more varus, but otherwise we were in the best position, we could do with the size of implants.   We dislocated the hip, removed the trial components.  I placed the real Corail femoral component, size 9 with standard offset and the real 32+1 metal hip ball and again reduced it into the pelvis.  We appreciated the stability on mechanical exam and  radiographic exam.  We then irrigated the soft tissue with normal saline solution using pulsatile lavage.  We closed the joint capsule with interrupted #1 Ethibond suture followed by #1 Vicryl, closed the tensor fascia.  0 Vicryl was used to close deep  tissue and 2-0 Vicryl was used to close subcutaneous tissue.  The skin was closed with staples.  Xeroform and Aquacel dressing was applied.  She was taken off the Hana table and taken to recovery room in stable condition with all final counts being  correct.  No complications noted.  Of note, Benita Stabile, PA-C did assist during the entire case and his assistance was crucial for facilitating all aspects of this case from beginning to end.   PUS D: 08/21/2020 10:06:32 am T: 08/21/2020 2:09:00 pm  JOB: [id number]/ [id number]

## 2020-08-21 NOTE — Interval H&P Note (Signed)
History and Physical Interval Note: The patient understands fully that she is here today for a left total hip replacement to treat her left hip osteoarthritis.  There has been no acute or interval change in her medical status.  See recent H&P.  The risks and benefits of surgery have been explained in detail and informed consent was obtained.  The left hip has been marked.  08/21/2020 6:49 AM  Cindy Lambert  has presented today for surgery, with the diagnosis of Osteoarthritis Left Hip.  The various methods of treatment have been discussed with the patient and family. After consideration of risks, benefits and other options for treatment, the patient has consented to  Procedure(s): LEFT TOTAL HIP ARTHROPLASTY ANTERIOR APPROACH (Left) as a surgical intervention.  The patient's history has been reviewed, patient examined, no change in status, stable for surgery.  I have reviewed the patient's chart and labs.  Questions were answered to the patient's satisfaction.     Mcarthur Rossetti

## 2020-08-21 NOTE — Transfer of Care (Signed)
Immediate Anesthesia Transfer of Care Note  Patient: Cindy Lambert  Procedure(s) Performed: LEFT TOTAL HIP ARTHROPLASTY ANTERIOR APPROACH (Left Hip)  Patient Location: PACU  Anesthesia Type:Spinal  Level of Consciousness: awake, alert  and oriented  Airway & Oxygen Therapy: Patient Spontanous Breathing and Patient connected to face mask oxygen  Post-op Assessment: Report given to RN and Post -op Vital signs reviewed and stable  Post vital signs: Reviewed and stable  Last Vitals:  Vitals Value Taken Time  BP 125/65 08/21/20 1023  Temp    Pulse 106 08/21/20 1025  Resp 17 08/21/20 1025  SpO2 96 % 08/21/20 1025  Vitals shown include unvalidated device data.  Last Pain:  Vitals:   08/21/20 0629  TempSrc:   PainSc: 0-No pain      Patients Stated Pain Goal: 2 (53/79/43 2761)  Complications: No complications documented.

## 2020-08-21 NOTE — Evaluation (Signed)
Physical Therapy Evaluation Patient Details Name: Cindy Lambert MRN: [id number] DOB: [birth year] Today's Date: 08/21/2020   History of Present Illness  Patient is 75 y.o. female s/p Lt THA anterior approach on 08/21/20 with PMH significant for HTN, DM, OA, depression.  Clinical Impression  Cindy Lambert is a 75 y.o. female POD 0 s/p Lt THA. Patient reports independence with mobility at baseline. Patient is now limited by functional impairments (see PT problem list below) and requires min assist/guard for transfers and gait with RW. Patient was able to ambulate ~110 feet with RW and min guard/assist. Patient instructed in exercise to facilitate circulation to reduce risk of DVT. Patient will benefit from continued skilled PT interventions to address impairments and progress towards PLOF. Acute PT will follow to progress mobility and stair training in preparation for safe discharge home.     Follow Up Recommendations Follow surgeon's recommendation for DC plan and follow-up therapies;Home health PT    Equipment Recommendations  None recommended by PT    Recommendations for Other Services       Precautions / Restrictions Precautions Precautions: Fall Restrictions Weight Bearing Restrictions: No Other Position/Activity Restrictions: WBAT      Mobility  Bed Mobility Overal bed mobility: Needs Assistance Bed Mobility: Supine to Sit     Supine to sit: Min assist;HOB elevated     General bed mobility comments: Cues to use bed rail and assist to fully pivot and raise trunk as well as support Lt LE due to pain.    Transfers Overall transfer level: Needs assistance Equipment used: Rolling walker (2 wheeled) Transfers: Sit to/from Stand Sit to Stand: Min assist;From elevated surface         General transfer comment: Min cues for hand placement/technique and assist for power up. pt steady once standing.  Ambulation/Gait Ambulation/Gait assistance: Min assist;Min guard Gait Distance  (Feet): 110 Feet Assistive device: Rolling walker (2 wheeled) Gait Pattern/deviations: Step-to pattern;Step-through pattern;Decreased stride length Gait velocity: decr   General Gait Details: Cues for step to pattern and proximity to RW. pt with shortened step length initially and progressed to more normalized step through pattern. no overt LOB noted and pt amb with min guard at end of gait.  Stairs            Wheelchair Mobility    Modified Rankin (Stroke Patients Only)       Balance Overall balance assessment: Mild deficits observed, not formally tested                                           Pertinent Vitals/Pain Pain Assessment: Faces Faces Pain Scale: Hurts a little bit Pain Location: Lt hip Pain Descriptors / Indicators: Discomfort Pain Intervention(s): Monitored during session;Limited activity within patient's tolerance;Repositioned;Ice applied    Home Living Family/patient expects to be discharged to:: Private residence Living Arrangements: Alone Available Help at Discharge: Family Type of Home: House Home Access: Stairs to enter Entrance Stairs-Rails: None Entrance Stairs-Number of Steps: 1 Home Layout: One level Home Equipment: Environmental consultant - 2 wheels;Shower seat;Grab bars - toilet;Grab bars - tub/shower      Prior Function Level of Independence: Independent               Hand Dominance        Extremity/Trunk Assessment   Upper Extremity Assessment Upper Extremity Assessment: Overall WFL for tasks assessed    Lower Extremity  Assessment Lower Extremity Assessment: Overall WFL for tasks assessed    Cervical / Trunk Assessment Cervical / Trunk Assessment: Normal  Communication   Communication: No difficulties  Cognition Arousal/Alertness: Awake/alert Behavior During Therapy: WFL for tasks assessed/performed Overall Cognitive Status: Within Functional Limits for tasks assessed                                         General Comments      Exercises Total Joint Exercises Ankle Circles/Pumps: AROM;Both;20 reps;Seated   Assessment/Plan    PT Assessment Patient needs continued PT services  PT Problem List Decreased strength;Decreased range of motion;Decreased activity tolerance;Decreased balance;Decreased mobility;Decreased knowledge of use of DME;Decreased knowledge of precautions       PT Treatment Interventions DME instruction;Gait training;Stair training;Functional mobility training;Therapeutic activities;Therapeutic exercise;Balance training;Patient/family education    PT Goals (Current goals can be found in the Care Plan section)  Acute Rehab PT Goals Patient Stated Goal: take care of her new puppy PT Goal Formulation: With patient Time For Goal Achievement: 08/28/20 Potential to Achieve Goals: Good    Frequency 7X/week   Barriers to discharge        Co-evaluation               AM-PAC PT "6 Clicks" Mobility  Outcome Measure Help needed turning from your back to your side while in a flat bed without using bedrails?: A Little Help needed moving from lying on your back to sitting on the side of a flat bed without using bedrails?: A Little Help needed moving to and from a bed to a chair (including a wheelchair)?: A Little Help needed standing up from a chair using your arms (e.g., wheelchair or bedside chair)?: A Little Help needed to walk in hospital room?: A Little Help needed climbing 3-5 steps with a railing? : A Little 6 Click Score: 18    End of Session Equipment Utilized During Treatment: Gait belt Activity Tolerance: Patient tolerated treatment well Patient left: in chair;with call bell/phone within reach;with chair alarm set;with family/visitor present Nurse Communication: Mobility status PT Visit Diagnosis: Muscle weakness (generalized) (M62.81);Difficulty in walking, not elsewhere classified (R26.2)    Time: 6160-7371 PT Time Calculation (min) (ACUTE  ONLY): 21 min   Charges:   PT Evaluation $PT Eval Low Complexity: 1 Low          Verner Mould, DPT Acute Rehabilitation Services Office [phone] Pager [phone]    Jacques Navy 08/21/2020, 5:00 PM

## 2020-08-21 NOTE — Anesthesia Procedure Notes (Signed)
Spinal  Patient location during procedure: OR End time: 08/21/2020 8:56 AM Reason for block: surgical anesthesia Staffing Performed: anesthesiologist and resident/CRNA  Resident/CRNA: Lainy Wrobleski D, CRNA Preanesthetic Checklist Completed: patient identified, IV checked, site marked, risks and benefits discussed, surgical consent, monitors and equipment checked, pre-op evaluation and timeout performed Spinal Block Patient position: sitting Prep: DuraPrep Patient monitoring: heart rate, continuous pulse ox and blood pressure Approach: midline Location: L3-4 Injection technique: single-shot Needle Needle type: Sprotte  Needle gauge: 24 G Needle length: 9 cm Assessment Sensory level: T6 Events: CSF return Additional Notes Expiration date of kit checked and confirmed. Patient tolerated procedure well, without complications.

## 2020-08-21 NOTE — Plan of Care (Signed)
  Problem: Pain Management: Goal: Pain level will decrease with appropriate interventions Outcome: Progressing   

## 2020-08-22 DIAGNOSIS — M1612 Unilateral primary osteoarthritis, left hip: Secondary | ICD-10-CM | POA: Diagnosis not present

## 2020-08-22 LAB — CBC
HCT: 31.2 % — ABNORMAL LOW (ref 36.0–46.0)
Hemoglobin: 10.3 g/dL — ABNORMAL LOW (ref 12.0–15.0)
MCH: 31.5 pg (ref 26.0–34.0)
MCHC: 33 g/dL (ref 30.0–36.0)
MCV: 95.4 fL (ref 80.0–100.0)
Platelets: 241 10*3/uL (ref 150–400)
RBC: 3.27 MIL/uL — ABNORMAL LOW (ref 3.87–5.11)
RDW: 12.4 % (ref 11.5–15.5)
WBC: 10.1 10*3/uL (ref 4.0–10.5)
nRBC: 0 % (ref 0.0–0.2)

## 2020-08-22 LAB — BASIC METABOLIC PANEL
Anion gap: 8 (ref 5–15)
BUN: 16 mg/dL (ref 8–23)
CO2: 25 mmol/L (ref 22–32)
Calcium: 8.2 mg/dL — ABNORMAL LOW (ref 8.9–10.3)
Chloride: 103 mmol/L (ref 98–111)
Creatinine, Ser: 0.98 mg/dL (ref 0.44–1.00)
GFR, Estimated: >60 mL/min (ref >60)
Glucose, Bld: 143 mg/dL — ABNORMAL HIGH (ref 70–99)
Potassium: 3.9 mmol/L (ref 3.5–5.1)
Sodium: 136 mmol/L (ref 135–145)

## 2020-08-22 MED ORDER — OXYCODONE HCL 5 MG PO TABS: 5.0000 mg | ORAL_TABLET | Freq: Four times a day (QID) | ORAL | 0 refills | Status: DC | PRN

## 2020-08-22 MED ORDER — METHOCARBAMOL 500 MG PO TABS: 500.0000 mg | ORAL_TABLET | Freq: Four times a day (QID) | ORAL | 1 refills | Status: DC | PRN

## 2020-08-22 MED ORDER — ASPIRIN 81 MG PO CHEW: 81.0000 mg | CHEWABLE_TABLET | Freq: Two times a day (BID) | ORAL | 0 refills | Status: AC

## 2020-08-22 NOTE — Progress Notes (Signed)
Physical Therapy Treatment Patient Details Name: Cindy Lambert MRN: [id number] DOB: July 25, 1945 Today's Date: 08/22/2020    History of Present Illness Patient is 75 y.o. female s/p Lt THA anterior approach on 08/21/20 with PMH significant for HTN, DM, OA, depression.    PT Comments    Pt demonstrates good mobility progression today, ambulatory for >120 ft in hallway with RW and step-through gait pattern. Pt also proficiently navigated one step and performed LE exercises with PT cuing and light assist. Pt's daughter at bedside, able to assist pt at d/c as needed with exercises and in/out of bed. Pt endorses very little post-operative pain. PT administered, reviewed, and practiced THA exercises with pt, pt and family with no further questions. Pt appropriate to d/c home from PT standpoint.    Follow Up Recommendations  Follow surgeon's recommendation for DC plan and follow-up therapies;Home health PT     Equipment Recommendations  None recommended by PT    Recommendations for Other Services       Precautions / Restrictions Precautions Precautions: Fall Restrictions Weight Bearing Restrictions: No LLE Weight Bearing: Weight bearing as tolerated Other Position/Activity Restrictions: WBAT    Mobility  Bed Mobility Overal bed mobility: Needs Assistance Bed Mobility: Supine to Sit     Supine to sit: Min assist;HOB elevated     General bed mobility comments: min assist for LLE lifting and translation to EOB, pt's daughter in room and can assist with this at home.    Transfers Overall transfer level: Needs assistance Equipment used: Rolling walker (2 wheeled) Transfers: Sit to/from Stand Sit to Stand: From elevated surface;Supervision         General transfer comment: for safety, verbal cuing for hand placement when rising/sitting.  Ambulation/Gait Ambulation/Gait assistance: Min guard Gait Distance (Feet): 125 Feet Assistive device: Rolling walker (2 wheeled) Gait  Pattern/deviations: Step-through pattern;Decreased stride length;Trunk flexed Gait velocity: decr   General Gait Details: For safety, transitioning to periods of supervision. Verbal cuing for upright posture, placement in RW, sequencing with step-through gait   Stairs Stairs: Yes Stairs assistance: Min guard Stair Management: No rails;Step to pattern;Forwards;With walker Number of Stairs: 1 General stair comments: min guard for safety, verbal cuing for sequencing (ascending with "good" leg, descending with "bad" leg leading), safe placement of RW, and caregiver guarding.   Wheelchair Mobility    Modified Rankin (Stroke Patients Only)       Balance Overall balance assessment: Mild deficits observed, not formally tested                                          Cognition Arousal/Alertness: Awake/alert Behavior During Therapy: WFL for tasks assessed/performed Overall Cognitive Status: Within Functional Limits for tasks assessed                                        Exercises Total Joint Exercises Ankle Circles/Pumps: AROM;Both;15 reps;Seated Quad Sets: AROM;Left;10 reps;Seated Heel Slides: AAROM;AROM;Left;5 reps Hip ABduction/ADduction: AROM;AAROM;Left;5 reps;Seated Long Arc Quad: AROM;Left;5 reps;Seated    General Comments        Pertinent Vitals/Pain Pain Assessment: Faces Faces Pain Scale: Hurts a little bit Pain Location: Lt hip Pain Descriptors / Indicators: Sore;Operative site guarding Pain Intervention(s): Limited activity within patient's tolerance;Monitored during session;Premedicated before session    Home Living  Prior Function            PT Goals (current goals can now be found in the care plan section) Acute Rehab PT Goals Patient Stated Goal: take care of her new puppy PT Goal Formulation: With patient Time For Goal Achievement: 08/28/20 Potential to Achieve Goals: Good Progress  towards PT goals: Progressing toward goals    Frequency    7X/week      PT Plan Current plan remains appropriate    Co-evaluation              AM-PAC PT "6 Clicks" Mobility   Outcome Measure  Help needed turning from your back to your side while in a flat bed without using bedrails?: A Little Help needed moving from lying on your back to sitting on the side of a flat bed without using bedrails?: A Little Help needed moving to and from a bed to a chair (including a wheelchair)?: A Little Help needed standing up from a chair using your arms (e.g., wheelchair or bedside chair)?: A Little Help needed to walk in hospital room?: A Little Help needed climbing 3-5 steps with a railing? : A Little 6 Click Score: 18    End of Session   Activity Tolerance: Patient tolerated treatment well Patient left: in chair;with call bell/phone within reach;with chair alarm set;with family/visitor present Nurse Communication: Mobility status PT Visit Diagnosis: Muscle weakness (generalized) (M62.81);Difficulty in walking, not elsewhere classified (R26.2)     Time: 8127-5170 PT Time Calculation (min) (ACUTE ONLY): 30 min  Charges:  $Gait Training: 8-22 mins $Therapeutic Exercise: 8-22 mins                     Stacie Glaze, PT DPT Acute Rehabilitation Services Pager [phone]  Office [phone]    [place] 08/22/2020, 10:11 AM

## 2020-08-22 NOTE — Discharge Summary (Signed)
Patient ID: Noam Franzen MRN: [id number] DOB/AGE: 04-Jun-1945 75 y.o.  Admit date: 08/21/2020 Discharge date: 08/22/2020  Admission Diagnoses:  Principal Problem:   Unilateral primary osteoarthritis, left hip Active Problems:   Status post left hip replacement   Discharge Diagnoses:  Same  Past Medical History:  Diagnosis Date  . Arthritis   . Depression   . Diabetes mellitus without complication ([place])   . Hypertension     Surgeries: Procedure(s): LEFT TOTAL HIP ARTHROPLASTY ANTERIOR APPROACH on 08/21/2020   Consultants:   Discharged Condition: Improved  Hospital Course: Maridel Pixler is an 75 y.o. female who was admitted 08/21/2020 for operative treatment ofUnilateral primary osteoarthritis, left hip. Patient has severe unremitting pain that affects sleep, daily activities, and work/hobbies. After pre-op clearance the patient was taken to the operating room on 08/21/2020 and underwent  Procedure(s): LEFT TOTAL HIP ARTHROPLASTY ANTERIOR APPROACH.    Patient was given perioperative antibiotics:  Anti-infectives (From admission, onward)   Start     Dose/Rate Route Frequency Ordered Stop   08/21/20 1500  ceFAZolin (ANCEF) IVPB 1 g/50 mL premix        1 g 100 mL/hr over 30 Minutes Intravenous Every 6 hours 08/21/20 1152 08/21/20 2104   08/21/20 0615  ceFAZolin (ANCEF) IVPB 2g/100 mL premix        2 g 200 mL/hr over 30 Minutes Intravenous On call to O.R. 08/21/20 5188 08/21/20 0859       Patient was given sequential compression devices, early ambulation, and chemoprophylaxis to prevent DVT.  Patient benefited maximally from hospital stay and there were no complications.    Recent vital signs:  Patient Vitals for the past 24 hrs:  BP Temp Temp src Pulse Resp SpO2  08/22/20 0940 122/73 98 F (36.7 C) Oral 83 16 92 %  08/22/20 0516 131/68 98 F (36.7 C) Oral 81 18 92 %  08/22/20 0144 129/66 98.3 F (36.8 C) Oral 81 17 96 %  08/21/20 2023 (!) 142/64 98 F (36.7 C) Oral 82 16  92 %  08/21/20 1741 139/78 (!) 97.5 F (36.4 C) Oral 77 16 95 %  08/21/20 1358 135/62 (!) 97.5 F (36.4 C) Oral 75 16 93 %  08/21/20 1205 130/74 (!) 97.5 F (36.4 C) Oral 65 18 96 %  08/21/20 1130 129/71 -- -- 61 16 97 %  08/21/20 1115 124/68 -- -- 63 11 100 %  08/21/20 1110 -- (!) 97.5 F (36.4 C) -- -- -- --  08/21/20 1100 134/74 -- -- 74 18 98 %  08/21/20 1050 -- -- -- 71 10 96 %     Recent laboratory studies:  Recent Labs    08/22/20 0256  WBC 10.1  HGB 10.3*  HCT 31.2*  PLT 241  NA 136  K 3.9  CL 103  CO2 25  BUN 16  CREATININE 0.98  GLUCOSE 143*  CALCIUM 8.2*     Discharge Medications:   Allergies as of 08/22/2020      Reactions   Latex Swelling      Medication List    TAKE these medications   acidophilus Caps capsule Take 1 capsule by mouth daily.   APPLE CIDER VINEGAR PO Take 1 capsule by mouth in the morning and at bedtime.   aspirin 81 MG chewable tablet Chew 1 tablet (81 mg total) by mouth 2 (two) times daily.   atorvastatin 20 MG tablet Commonly known as: LIPITOR Take 20 mg by mouth every evening.   Calcium Carbonate-Vitamin D  600-200 MG-UNIT Tabs Take 1 tablet by mouth in the morning and at bedtime.   citalopram 20 MG tablet Commonly known as: CELEXA Take 20 mg by mouth in the morning.   clonazePAM 0.5 MG tablet Commonly known as: KLONOPIN Take 0.5 mg by mouth at bedtime.   Integra 62.5-62.5-40-3 MG Caps Take 1 tablet by mouth daily.   losartan 50 MG tablet Commonly known as: COZAAR Take 50 mg by mouth in the morning and at bedtime.   losartan-hydrochlorothiazide 100-12.5 MG tablet Commonly known as: HYZAAR Take 1 tablet by mouth daily.   metFORMIN 500 MG tablet Commonly known as: GLUCOPHAGE Take 500 mg by mouth daily with breakfast.   methocarbamol 500 MG tablet Commonly known as: ROBAXIN Take 1 tablet (500 mg total) by mouth every 6 (six) hours as needed for muscle spasms.   omeprazole 40 MG capsule Commonly known  as: PRILOSEC Take 40 mg by mouth in the morning.   oxyCODONE 5 MG immediate release tablet Commonly known as: Oxy IR/ROXICODONE Take 1-2 tablets (5-10 mg total) by mouth every 6 (six) hours as needed for moderate pain (pain score 4-6).   traZODone 150 MG tablet Commonly known as: DESYREL Take 150 mg by mouth at bedtime.            Durable Medical Equipment  (From admission, onward)         Start     Ordered   08/21/20 1153  DME 3 n 1  Once        08/21/20 1152   08/21/20 1153  DME Walker rolling  Once       Question Answer Comment  Walker: With 5 Inch Wheels   Patient needs a walker to treat with the following condition Status post left hip replacement      08/21/20 1152          Diagnostic Studies: DG Pelvis Portable  Result Date: 08/21/2020 CLINICAL DATA:  Total left hip arthroplasty. EXAM: PORTABLE PELVIS 1-2 VIEWS COMPARISON:  Intraoperative films, same date. FINDINGS: The left total hip arthroplasty components are well seated. No complicating features are identified. The lower bony pelvis is intact. The right hip is intact. IMPRESSION: Well seated components of a total left hip arthroplasty. Electronically Signed   By: Marijo Sanes M.D.   On: 08/21/2020 11:26   DG C-Arm 1-60 Min-No Report  Result Date: 08/21/2020 Fluoroscopy was utilized by the requesting physician.  No radiographic interpretation.   DG HIP OPERATIVE UNILAT W OR W/O PELVIS LEFT  Result Date: 08/21/2020 CLINICAL DATA:  75 year old female undergoing left hip replacement. EXAM: OPERATIVE LEFT HIP (WITH PELVIS IF PERFORMED) 4 VIEWS TECHNIQUE: Fluoroscopic spot image(s) were submitted for interpretation post-operatively. COMPARISON:  Left hip series 07/13/2020. FINDINGS: 4 intraoperative fluoroscopic spot views of the lower pelvis and left hip. On the last 3 images bipolar left hip arthroplasty is in place. Hardware appears intact and normally located. No unexpected osseous changes. FLUOROSCOPY TIME:  0  minutes 15 seconds IMPRESSION: Intraoperative images of left bipolar hip arthroplasty with no adverse features. Electronically Signed   By: Genevie Ann M.D.   On: 08/21/2020 10:34    Disposition: Discharge disposition: 01-Home or [facility]    Mcarthur Rossetti, MD Follow up in 2 week(s).   Specialty: Orthopedic Surgery Contact information: [address] [place] [address] [phone]  Signed: Mcarthur Rossetti 08/22/2020, 10:48 AM

## 2020-08-22 NOTE — Discharge Instructions (Signed)

## 2020-08-22 NOTE — Plan of Care (Signed)
  Problem: Activity: Goal: Ability to tolerate increased activity will improve Outcome: Progressing   Problem: Pain Management: Goal: Pain level will decrease with appropriate interventions Outcome: Progressing   Problem: Education: Goal: Knowledge of General Education information will improve Description: Including pain rating scale, medication(s)/side effects and non-pharmacologic comfort measures Outcome: Progressing   Problem: Health Behavior/Discharge Planning: Goal: Ability to manage health-related needs will improve Outcome: Progressing   Problem: Activity: Goal: Risk for activity intolerance will decrease Outcome: Progressing   Problem: Pain Managment: Goal: General experience of comfort will improve Outcome: Progressing

## 2020-08-22 NOTE — Progress Notes (Signed)
The patient is alert and oriented and has been seen by her physician. The orders for discharge were written. IV has been removed. Went over discharge instructions with patient and family. She is about to be discharged via wheelchair with all of her belongings.   

## 2020-08-22 NOTE — Progress Notes (Signed)
Subjective: 1 Day Post-Op Procedure(s) (LRB): LEFT TOTAL HIP ARTHROPLASTY ANTERIOR APPROACH (Left) Patient reports pain as moderate.    Objective: Vital signs in last 24 hours: Temp:  [97.5 F (36.4 C)-98.3 F (36.8 C)] 98 F (36.7 C) (05/21 0940) Pulse Rate:  [61-83] 83 (05/21 0940) Resp:  [10-18] 16 (05/21 0940) BP: (122-142)/(62-78) 122/73 (05/21 0940) SpO2:  [92 %-100 %] 92 % (05/21 0940)  Intake/Output from previous day: 05/20 0701 - 05/21 0700 In: 3675.4 [P.O.:600; I.V.:2910.6; IV Piggyback:164.8] Out: 1900 [Urine:1650; Blood:250] Intake/Output this shift: Total I/O In: -  Out: 100 [Urine:100]  Recent Labs    08/22/20 0256  HGB 10.3*   Recent Labs    08/22/20 0256  WBC 10.1  RBC 3.27*  HCT 31.2*  PLT 241   Recent Labs    08/22/20 0256  NA 136  K 3.9  CL 103  CO2 25  BUN 16  CREATININE 0.98  GLUCOSE 143*  CALCIUM 8.2*   No results for input(s): LABPT, INR in the last 72 hours.  Sensation intact distally Intact pulses distally Dorsiflexion/Plantar flexion intact Incision: dressing C/D/I   Assessment/Plan: 1 Day Post-Op Procedure(s) (LRB): LEFT TOTAL HIP ARTHROPLASTY ANTERIOR APPROACH (Left) Up with therapy Discharge home with home health      Mcarthur Rossetti 08/22/2020, 10:45 AM

## 2020-08-24 ENCOUNTER — Encounter (HOSPITAL_COMMUNITY): Payer: Self-pay | Admitting: Orthopaedic Surgery

## 2020-09-03 ENCOUNTER — Ambulatory Visit (INDEPENDENT_AMBULATORY_CARE_PROVIDER_SITE_OTHER): Payer: Medicare Other | Admitting: Physician Assistant

## 2020-09-03 ENCOUNTER — Encounter: Payer: Self-pay | Admitting: Physician Assistant

## 2020-09-03 DIAGNOSIS — Z96642 Presence of left artificial hip joint: Secondary | ICD-10-CM

## 2020-09-03 NOTE — Progress Notes (Signed)
HPI: Ms. Monjaraz returns today 2 weeks status post left total hip arthroplasty.  She states she is overall doing great.  She just has some soreness about the hip.  She has had no fevers chills shortness of breath.  Has been on aspirin 81 mg twice daily.  She was on no aspirin prior to surgery.  She is taking no pain medications at this point.  Physical exam: Left hip surgical incision is healing well well approximated staples no signs of infection.  She ambulates without any assistive device and a nonantalgic gait.  She is able dorsiflex plantarflex her ankle.  Impression: Status post left total hip arthroplasty 08/21/2020  Plan: She will continue her 81 mg aspirin once daily for another week and then discontinue.  Staples removed Steri-Strips applied.  Scar tissue mobilization encouraged.  Follow-up with [place] in 1 month sooner if there is any questions concerns.

## 2020-09-18 ENCOUNTER — Telehealth: Payer: Self-pay | Admitting: Orthopaedic Surgery

## 2020-09-18 NOTE — Telephone Encounter (Signed)
Pt was discharge from [facility].

## 2020-10-08 ENCOUNTER — Ambulatory Visit: Payer: Medicare Other | Admitting: Orthopaedic Surgery

## 2020-10-22 ENCOUNTER — Encounter: Payer: Self-pay | Admitting: Orthopaedic Surgery

## 2020-10-22 ENCOUNTER — Ambulatory Visit (INDEPENDENT_AMBULATORY_CARE_PROVIDER_SITE_OTHER): Payer: Medicare Other | Admitting: Orthopaedic Surgery

## 2020-10-22 VITALS — Ht 66.0 in | Wt 172.0 lb

## 2020-10-22 DIAGNOSIS — Z96642 Presence of left artificial hip joint: Secondary | ICD-10-CM

## 2020-10-22 NOTE — Progress Notes (Signed)
The patient is now a week status post a left total hip arthroplasty.  She is doing well with only some mild soreness.  She is not taking pain medications.  She is a very young appearing 34.  She is actually moving to the [facility] area August 1.  She is walking without an assistive device.  Her incision looks good.  There is some mild pain and swelling to be expected.  Her left hip moves smoothly.  From my standpoint I do not need to see her back for 6 months from now unless there are issues.  At that visit I like a standing low AP pelvis and lateral of her left operative hip.  All questions and concerns were answered and addressed

## 2021-01-06 ENCOUNTER — Telehealth: Payer: Self-pay | Admitting: Orthopaedic Surgery

## 2021-01-06 NOTE — Telephone Encounter (Signed)
Pt called requesting a call back from Dr. Ninfa Linden concerning medical questions. Pt states she fell on her surgical hip [place]. She need a call back at [phone].

## 2021-04-26 ENCOUNTER — Other Ambulatory Visit: Payer: Self-pay

## 2021-04-26 ENCOUNTER — Ambulatory Visit (INDEPENDENT_AMBULATORY_CARE_PROVIDER_SITE_OTHER): Payer: Medicare Other | Admitting: Orthopaedic Surgery

## 2021-04-26 ENCOUNTER — Encounter: Payer: Self-pay | Admitting: Orthopaedic Surgery

## 2021-04-26 ENCOUNTER — Ambulatory Visit (INDEPENDENT_AMBULATORY_CARE_PROVIDER_SITE_OTHER): Payer: Medicare Other

## 2021-04-26 DIAGNOSIS — Z96642 Presence of left artificial hip joint: Secondary | ICD-10-CM

## 2021-04-26 NOTE — Progress Notes (Signed)
The patient is under a year out from a hip replacement on her left hip to treat the pain from osteoarthritis.  She is very active 76 year old female into the hip is doing very well for her.  She actually had a mechanical fall in August and developed a hematoma just posterior to the hip replacement but she has no groin pain he says she is doing well other than that hematoma which is slowly resolving.  She has had no acute change in her medical status.  Her left operative hip moves smoothly and fluidly with no pain at all.  Her incisions well-healed.  She does have some pain of the trochanteric area just posterior from her fall and hematoma but it looks good overall.  Her leg lengths are equal.  She has a normal stride to her gait.  An AP pelvis and lateral of the left hip shows a well-seated total hip arthroplasty with no complicating features.  At this point follow-up can be as needed.  All questions and concerns were answered addressed.  If she does develop any issues with that hip she knows to let us know.
